# Patient Record
Sex: Female | Born: 1975
Health system: Southern US, Community
[De-identification: ages and names within clinical notes are randomized; demographics above are authoritative.]

## PROBLEM LIST (undated history)

## (undated) DIAGNOSIS — K219 Gastro-esophageal reflux disease without esophagitis: Secondary | ICD-10-CM

## (undated) HISTORY — DX: Gastro-esophageal reflux disease without esophagitis: K21.9

---
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
## 1999-09-10 ENCOUNTER — Encounter: Admission: RE | Admit: 1999-09-10 | Discharge: 1999-09-10 | Payer: Self-pay | Admitting: Family Medicine

## 1999-09-10 ENCOUNTER — Encounter: Payer: Self-pay | Admitting: Family Medicine

## 1999-10-08 ENCOUNTER — Encounter (INDEPENDENT_AMBULATORY_CARE_PROVIDER_SITE_OTHER): Payer: Self-pay | Admitting: *Deleted

## 1999-10-08 ENCOUNTER — Encounter: Payer: Self-pay | Admitting: Family Medicine

## 1999-10-08 ENCOUNTER — Ambulatory Visit (HOSPITAL_COMMUNITY): Admission: RE | Admit: 1999-10-08 | Discharge: 1999-10-08 | Payer: Self-pay | Admitting: Family Medicine

## 1999-12-13 ENCOUNTER — Emergency Department (HOSPITAL_COMMUNITY): Admission: EM | Admit: 1999-12-13 | Discharge: 1999-12-13 | Payer: Self-pay | Admitting: Emergency Medicine

## 1999-12-14 ENCOUNTER — Encounter: Payer: Self-pay | Admitting: Emergency Medicine

## 1999-12-16 ENCOUNTER — Encounter: Payer: Self-pay | Admitting: Family Medicine

## 1999-12-16 ENCOUNTER — Encounter: Admission: RE | Admit: 1999-12-16 | Discharge: 1999-12-16 | Payer: Self-pay | Admitting: Family Medicine

## 2000-06-30 ENCOUNTER — Encounter: Payer: Self-pay | Admitting: Family Medicine

## 2000-06-30 ENCOUNTER — Encounter: Admission: RE | Admit: 2000-06-30 | Discharge: 2000-06-30 | Payer: Self-pay | Admitting: Family Medicine

## 2001-04-18 HISTORY — PX: ARTHROSCOPIC REPAIR ACL: SUR80

## 2001-08-23 ENCOUNTER — Encounter: Payer: Self-pay | Admitting: Internal Medicine

## 2001-08-23 ENCOUNTER — Encounter: Admission: RE | Admit: 2001-08-23 | Discharge: 2001-08-23 | Payer: Self-pay | Admitting: Internal Medicine

## 2002-06-13 ENCOUNTER — Encounter: Admission: RE | Admit: 2002-06-13 | Discharge: 2002-06-13 | Payer: Self-pay | Admitting: Family Medicine

## 2002-06-13 ENCOUNTER — Encounter: Payer: Self-pay | Admitting: Family Medicine

## 2003-11-19 ENCOUNTER — Other Ambulatory Visit: Admission: RE | Admit: 2003-11-19 | Discharge: 2003-11-19 | Payer: Self-pay | Admitting: Family Medicine

## 2005-08-11 ENCOUNTER — Other Ambulatory Visit: Admission: RE | Admit: 2005-08-11 | Discharge: 2005-08-11 | Payer: Self-pay | Admitting: Family Medicine

## 2007-01-29 ENCOUNTER — Emergency Department (HOSPITAL_COMMUNITY): Admission: EM | Admit: 2007-01-29 | Discharge: 2007-01-29 | Payer: Self-pay | Admitting: Emergency Medicine

## 2007-04-06 ENCOUNTER — Encounter (INDEPENDENT_AMBULATORY_CARE_PROVIDER_SITE_OTHER): Payer: Self-pay | Admitting: Surgery

## 2007-04-06 ENCOUNTER — Ambulatory Visit (HOSPITAL_COMMUNITY): Admission: RE | Admit: 2007-04-06 | Discharge: 2007-04-06 | Payer: Self-pay | Admitting: Surgery

## 2008-05-13 ENCOUNTER — Ambulatory Visit (HOSPITAL_COMMUNITY): Admission: RE | Admit: 2008-05-13 | Discharge: 2008-05-13 | Payer: Self-pay | Admitting: Obstetrics and Gynecology

## 2008-11-04 ENCOUNTER — Observation Stay (HOSPITAL_COMMUNITY): Admission: RE | Admit: 2008-11-04 | Discharge: 2008-11-04 | Payer: Self-pay | Admitting: Obstetrics and Gynecology

## 2008-11-13 ENCOUNTER — Inpatient Hospital Stay (HOSPITAL_COMMUNITY): Admission: RE | Admit: 2008-11-13 | Discharge: 2008-11-16 | Payer: Self-pay | Admitting: Obstetrics and Gynecology

## 2009-04-18 HISTORY — PX: LAPAROSCOPIC CHOLECYSTECTOMY: SUR755

## 2010-04-17 ENCOUNTER — Inpatient Hospital Stay (HOSPITAL_COMMUNITY)
Admission: AD | Admit: 2010-04-17 | Discharge: 2010-04-17 | Payer: Self-pay | Source: Home / Self Care | Attending: Obstetrics and Gynecology | Admitting: Obstetrics and Gynecology

## 2010-06-28 LAB — RH IMMUNE GLOBULIN WORKUP (NOT WOMEN'S HOSP)
ABO/RH(D): O NEG
Antibody Screen: NEGATIVE
Unit division: 0

## 2010-07-14 ENCOUNTER — Inpatient Hospital Stay (HOSPITAL_COMMUNITY): Payer: 59

## 2010-07-14 ENCOUNTER — Inpatient Hospital Stay (HOSPITAL_COMMUNITY)
Admit: 2010-07-14 | Discharge: 2010-07-14 | Disposition: A | Discharge status: Home / Self Care | Payer: 59 | Attending: Surgery | Admitting: Surgery

## 2010-07-14 ENCOUNTER — Inpatient Hospital Stay (HOSPITAL_COMMUNITY)
Admission: AD | Admit: 2010-07-14 | Discharge: 2010-07-14 | Disposition: A | Discharge status: Home / Self Care | Payer: 59 | Source: Ambulatory Visit | Attending: Obstetrics | Admitting: Obstetrics

## 2010-07-14 ENCOUNTER — Other Ambulatory Visit (HOSPITAL_COMMUNITY): Payer: Self-pay | Admitting: Surgery

## 2010-07-14 ENCOUNTER — Encounter (HOSPITAL_COMMUNITY): Payer: Self-pay | Admitting: Radiology

## 2010-07-14 DIAGNOSIS — R109 Unspecified abdominal pain: Secondary | ICD-10-CM | POA: Insufficient documentation

## 2010-07-14 DIAGNOSIS — O99891 Other specified diseases and conditions complicating pregnancy: Secondary | ICD-10-CM | POA: Insufficient documentation

## 2010-07-14 DIAGNOSIS — R52 Pain, unspecified: Secondary | ICD-10-CM

## 2010-07-14 DIAGNOSIS — R1031 Right lower quadrant pain: Secondary | ICD-10-CM | POA: Insufficient documentation

## 2010-07-14 DIAGNOSIS — K37 Unspecified appendicitis: Secondary | ICD-10-CM

## 2010-07-14 DIAGNOSIS — O321XX0 Maternal care for breech presentation, not applicable or unspecified: Secondary | ICD-10-CM | POA: Insufficient documentation

## 2010-07-14 LAB — DIFFERENTIAL
Basophils Absolute: 0 10*3/uL (ref 0.0–0.1)
Basophils Relative: 0 % (ref 0–1)
Eosinophils Absolute: 0 10*3/uL (ref 0.0–0.7)
Monocytes Absolute: 0.5 10*3/uL (ref 0.1–1.0)
Monocytes Relative: 5 % (ref 3–12)
Neutro Abs: 6.5 10*3/uL (ref 1.7–7.7)
Neutrophils Relative %: 74 % (ref 43–77)

## 2010-07-14 LAB — SAMPLE TO BLOOD BANK

## 2010-07-14 LAB — CBC
Hemoglobin: 13 g/dL (ref 12.0–15.0)
MCH: 31.9 pg (ref 26.0–34.0)
MCHC: 33.9 g/dL (ref 30.0–36.0)
Platelets: 202 10*3/uL (ref 150–400)

## 2010-07-25 LAB — RH IMMUNE GLOBULIN WORKUP (NOT WOMEN'S HOSP)

## 2010-07-25 LAB — RH IMMUNE GLOB WKUP(>/=20WKS)(NOT WOMEN'S HOSP)

## 2010-07-25 LAB — CBC
HCT: 32.7 % — ABNORMAL LOW (ref 36.0–46.0)
Hemoglobin: 11.2 g/dL — ABNORMAL LOW (ref 12.0–15.0)
Hemoglobin: 13 g/dL (ref 12.0–15.0)
MCV: 96.3 fL (ref 78.0–100.0)
Platelets: 150 10*3/uL (ref 150–400)
RBC: 3.39 MIL/uL — ABNORMAL LOW (ref 3.87–5.11)
RBC: 3.95 MIL/uL (ref 3.87–5.11)
WBC: 8.2 10*3/uL (ref 4.0–10.5)
WBC: 8.5 10*3/uL (ref 4.0–10.5)

## 2010-07-25 LAB — RPR: RPR Ser Ql: NONREACTIVE

## 2010-08-03 IMAGING — US US ABDOMEN COMPLETE
1 series · 14 of 25 positions shown · non-contrast
Comparison: March 31, 2007

CLINICAL DATA: Abdominal pain

ABDOMINAL ULTRASOUND
TECHNIQUE: Abdominal ultrasound examination was performed
including evaluation of the liver, gallbladder, bile ducts,
pancreas, kidneys, spleen, IVC, and abdominal aorta.

[Series 1: us abdomen complete · 14 of 62 slices shown]
[im 1/62]
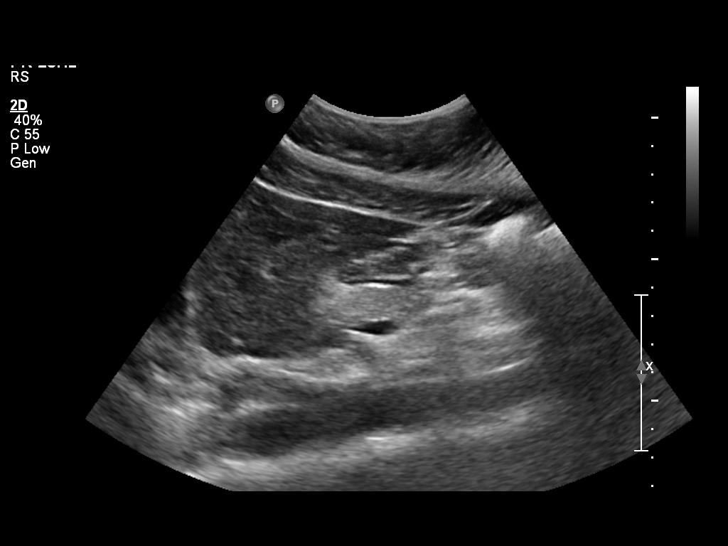
[im 6/62]
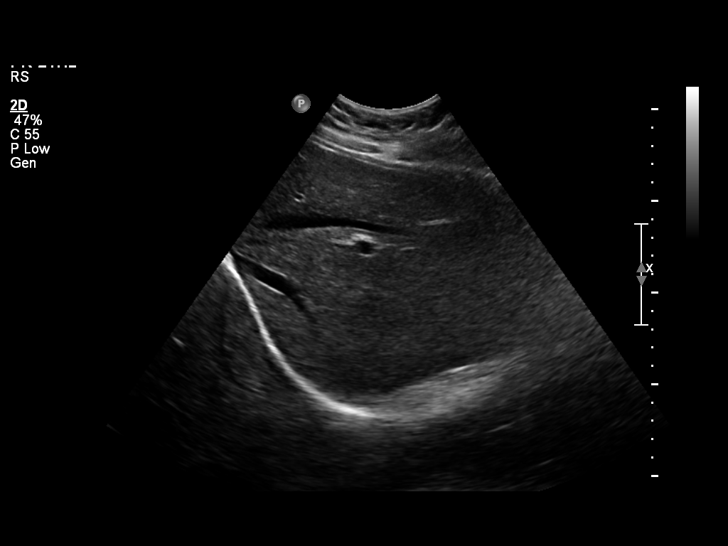
[im 11/62]
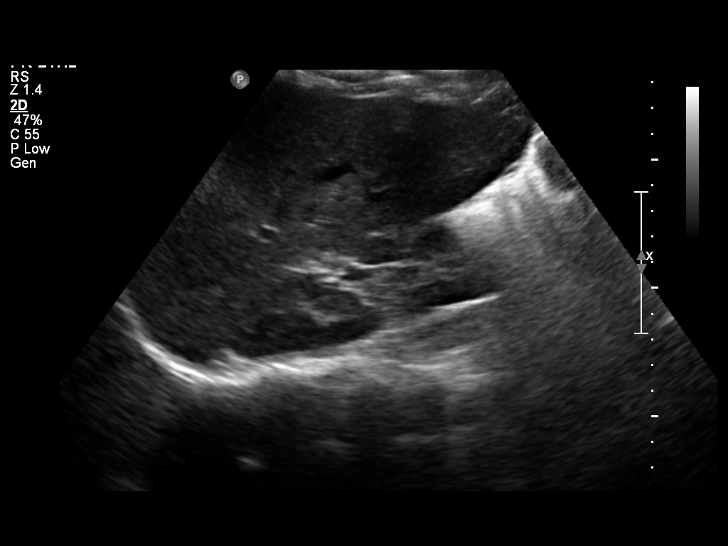
[im 16/62]
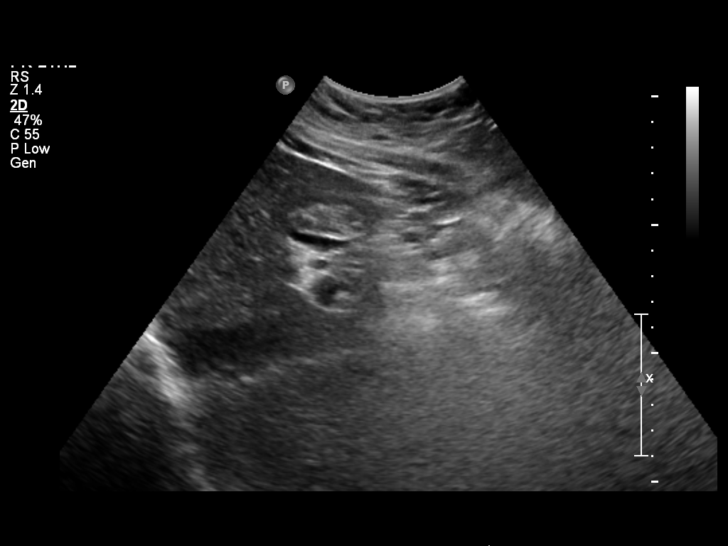
[im 21/62]
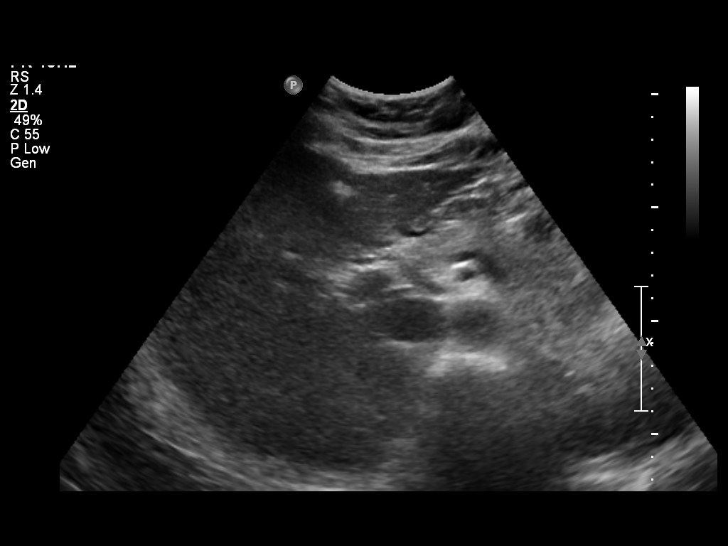
[im 23/62]
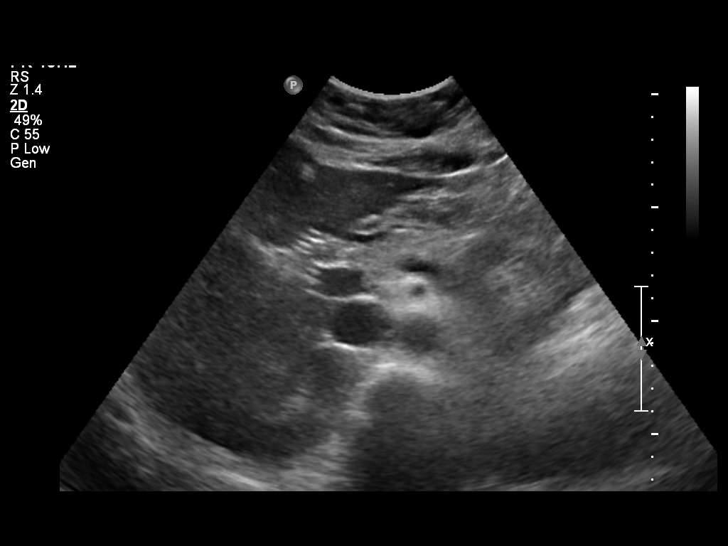
[im 28/62]
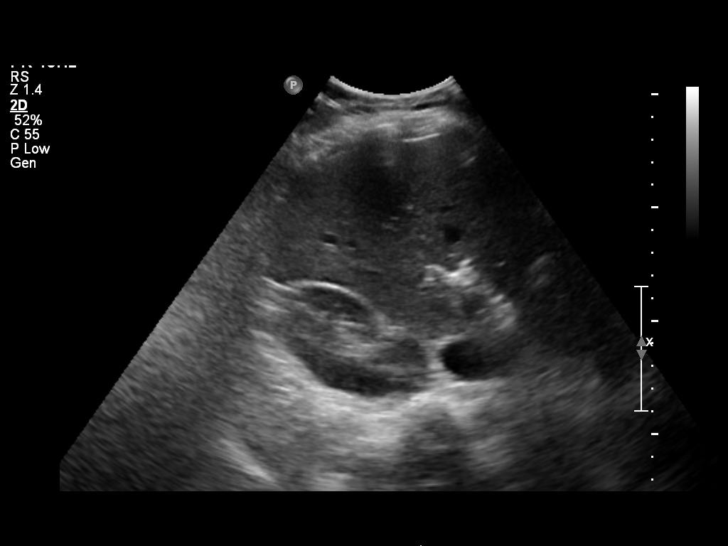
[im 34/62]
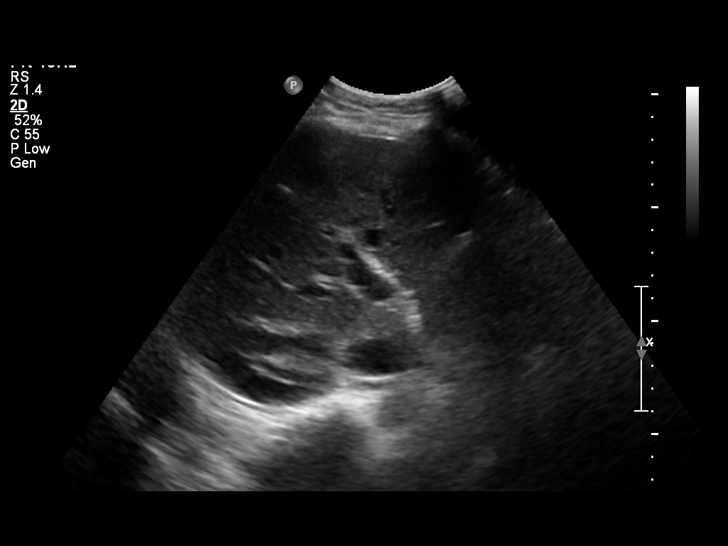
[im 39/62]
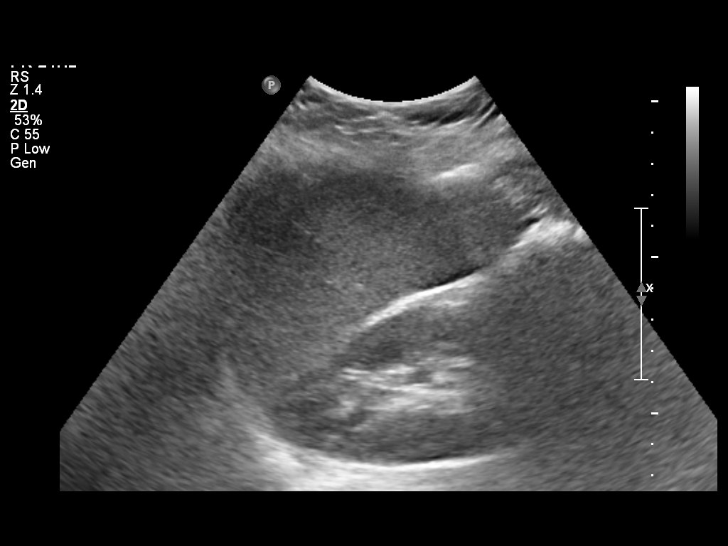
[im 41/62]
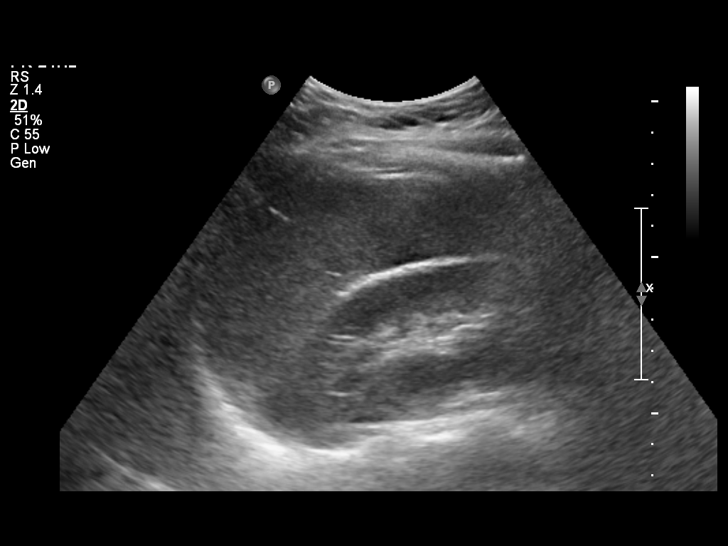
[im 46/62]
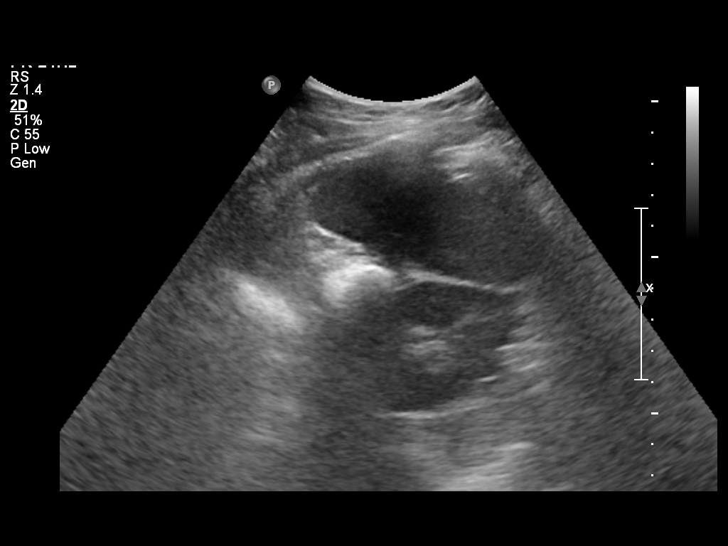
[im 51/62]
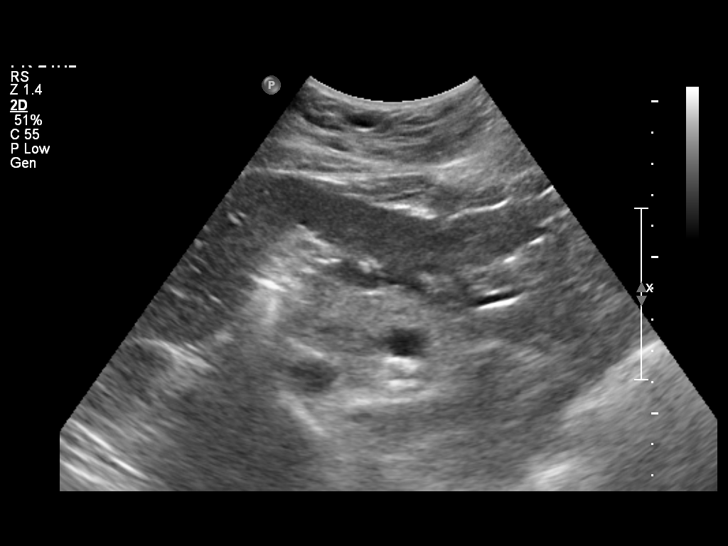
[im 56/62]
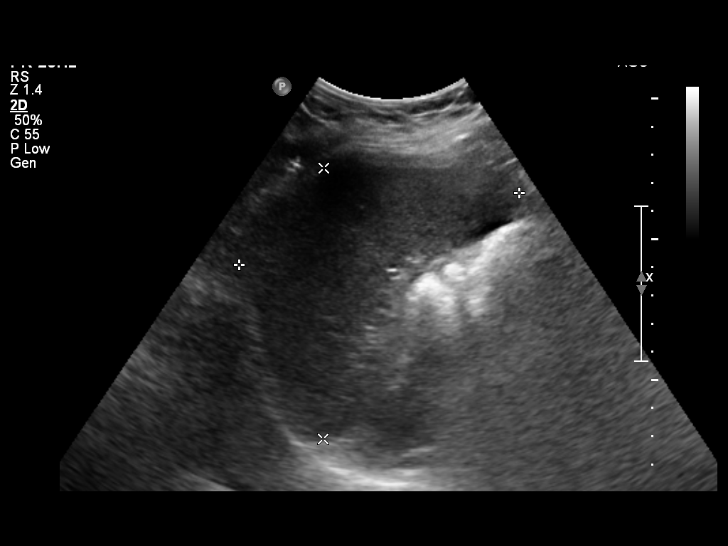
[im 62/62]
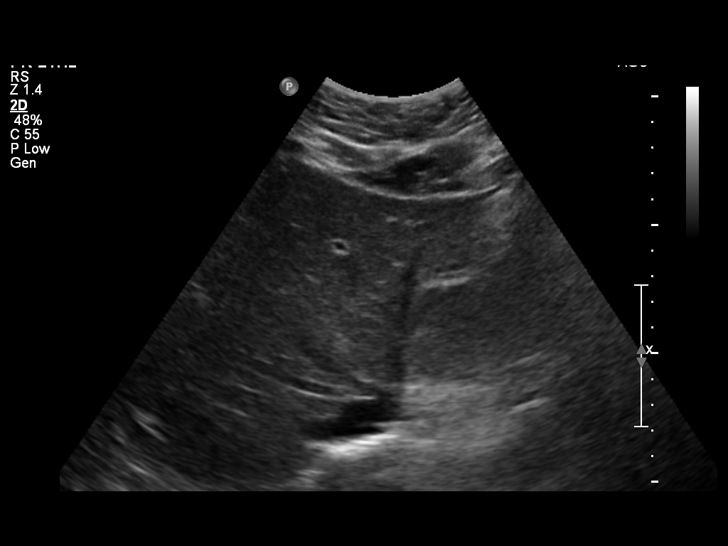

[14 of 25 positions shown; findings below may reference images not displayed]

FINDINGS: Gallbladder:  Surgically absent.

Common bile duct: Within normal limits in caliber.

Liver:  No focal parenchymal abnormalities.  Within normal limits
in parenchymal echogenicity.

Inferior vena cava:  Visualized portion unremarkable.

Pancreas:  Visualized portion unremarkable.

Spleen:  Within normal limits in size and echogenicity.

Right kidney:  Within normal limits in size and echogenicity. No
evidence of mass or hydronephrosis.

Left kidney:  Within normal limits in size and echogenicity. No
evidence of mass or hydronephrosis.

Abdominal aorta:  Within normal limits in caliber.
IMPRESSION: Negative abdominal ultrasound.

## 2010-08-31 NOTE — Op Note (Signed)
Rebecca Rivers, Rebecca Rivers            ACCOUNT NO.:  [id number]   MEDICAL RECORD NO.:  [id number]          PATIENT TYPE:  INP   LOCATION:  9126                          FACILITY:  [facility]   PHYSICIAN:  Lenoard Aden, M.D.DATE OF BIRTH:  [birth year]   DATE OF PROCEDURE:  11/13/2008  DATE OF DISCHARGE:                               OPERATIVE REPORT   PREOPERATIVE DIAGNOSES:  A 39-week intrauterine pregnancy, footling  breech presentation, status post failed external cephalic version.   POSTOPERATIVE DIAGNOSES:  A 39-week intrauterine pregnancy, footling  breech presentation, status post failed external cephalic version.   PROCEDURE:  Primary low segment transverse cesarean section.   SURGEON:  .Lenoard Aden, MD   ASSISTANT:  Marlinda Mike, CNM   ANESTHESIA:  Spinal by Malen Gauze.   ESTIMATED BLOOD LOSS:  500 mL.   COMPLICATIONS:  None.   DRAINS:  Foley counts correct.  The patient went to recovery in good  condition.   FINDINGS:  Full-term living female, footling breech position, Apgars 8 and  9.  Anterior placenta.  Right subserosal mid body/fundal fibroid  approximately 2-3 cm.  Normal tubes.  Normal ovaries.  Two-layer uterine  closure.  No extensions.   BRIEF OPERATIVE NOTE:  After being apprised of risks of anesthesia,  infection, bleeding, injury to abdominal organs, need for repair, and  delayed versus complications to include bowel and bladder injury.  The  patient brought to the operating room, where she was administered a  spinal anesthetic without complications, prepped and draped in usual  sterile fashion.  Foley catheter placed after achieving adequate  anesthesia.  Dilute Marcaine solution placed.  A Pfannenstiel skin  incision made with a scalpel, carried down to the fascia which was  nicked to the midline and transverse using Mayo scissors.  Rectus  muscles dissected sharply in the midline.  Peritoneum entered sharply.  Bladder blade placed.  Visceral peritoneum  scored sharply off the lower  uterine segment.  Kerr hysterotomy incision made.  Atraumatic delivery  of a full-term living female from footling breech position.  A standard  maneuvers performed with flexion of the fetal vertex upon delivery,  Apgars 8 and 9.  Pediatricians in attendance.  Cord blood collected.  Placenta delivered manually intact 3-vessel cord.  Uterus exteriorized,  curette using dry lap pack, and closed in 2 running imbricating layers  of 0 Monocryl suture.  Interrupted suture placed in the midline for  hemostasis.  Bladder flap inspected and found to be hemostatic.  Irrigation accomplished.  Dilute Marcaine solution placed into  the peritoneal cavity for additional anesthesia.  At this time, fascia  then closed using 0 Monocryl in a running fashion.  Subcutaneous tissue  reapproximated using a 2-0 plain in a running fashion.  Skin was closed  using staples.  The patient tolerates the procedure well and taken to  recovery in good condition.      Lenoard Aden, M.D.  Electronically Signed     RJT/MEDQ  D:  11/13/2008  T:  11/14/2008  Job:  [id number]

## 2010-08-31 NOTE — Op Note (Signed)
Rebecca Rivers, Rebecca Rivers            ACCOUNT NO.:  [id number]   MEDICAL RECORD NO.:  [id number]          PATIENT TYPE:  INP   LOCATION:  9171                          FACILITY:  [facility]   PHYSICIAN:  Lenoard Aden, M.D.DATE OF BIRTH:  Dec 06, 1975   DATE OF PROCEDURE:  11/04/2008  DATE OF DISCHARGE:  11/04/2008                               OPERATIVE REPORT   PREOPERATIVE DIAGNOSIS:  Breech for external cephalic version.   POSTOPERATIVE DIAGNOSIS:  Breech for external cephalic version.   PROCEDURE:  Attempted external cephalic version.   SURGEON:  Lenoard Aden, MD and Marlinda Mike, CNM   ESTIMATED BLOOD LOSS:  None.   COMPLICATIONS:  None.   Fetal heart tones noted pre and postprocedure with reactive NST.  Pre  and postprocedure anesthesia Stadol.   BRIEF OPERATIVE NOTE:  After being apprised of the risks and benefits of  the external cephalic version including a 50% failure rate, possible  need for emergent surgery due to fetal bradycardia, the patient is  brought to labor and delivery, where she is given an IV administered IV  Stadol.  NST was reactive.  Fetal vertex was found in the maternal right  upper quadrant, backup attempted version with 4 roll was attempted on  multiple occasions for the exact noting the fetal heart tones during the  procedure with ultrasound guidance.  After attempting on multiple  occasions, the fetus was able to be moved to transverse position, but  not to a vertex position.  At this time, procedure was terminated.  NST  was repeated and reactive.  No bleeding or fluid leakage is noted.  The  patient tolerates the procedure well and was recovering in good  condition.      Lenoard Aden, M.D.  Electronically Signed     RJT/MEDQ  D:  11/04/2008  T:  11/05/2008  Job:  [id number]

## 2010-08-31 NOTE — Op Note (Signed)
NAMELASHARN, BUFKIN            ACCOUNT NO.:  [id number]   MEDICAL RECORD NO.:  [id number]          PATIENT TYPE:  AMB   LOCATION:  DAY                          [facility]ACILITY[facility]   PHYSICIAN:  Ardeth Sportsman, MD     DATE OF BIRTH:  [birth year]   DATE OF PROCEDURE:  04/06/2007  DATE OF DISCHARGE:                               OPERATIVE REPORT   PRIMARY CARE PHYSICIAN:  Gretta Arab. Valentina Lucks, MD.   OTHER PHYSICIAN:  OB/GYN, Chester Holstein. Earlene Plater, MD.   PREOPERATIVE DIAGNOSIS:  Symptomatic cholecystolithiasis.   POSTOPERATIVE DIAGNOSIS:  Symptomatic cholecystolithiasis.   PROCEDURE PERFORMED:  Laparoscopic cholecystectomy with intraoperative  cholangiogram.   SURGEON:  Ardeth Sportsman, MD.   ASSISTANT:  Claud Kelp, MD.   ANESTHESIA:  1. General anesthesia.  2. Local anesthetic in a field block around all port sites.   SPECIMENS:  Gallbladder.   DRAINS:  None.   ESTIMATED BLOOD LOSS:  Less than 10 ml.   COMPLICATIONS:  None apparent.   INDICATIONS:  Ms. Pilger is a 35 year old, pleasant female with a  classic story of biliary colic and known gallstones.  The  pathophysiology of cholecystolithiasis with the source of gallstone  pancreatitis, cholecystitis, and debilitating nausea, vomiting, and  dehydration were discussed.  Options were discussed and recommendation  was made for a laparoscopic cholecystectomy with intraoperative  cholangiogram.  Risks such as stroke, heart attack, deep venous  thrombosis, pulmonary embolus, and death were discussed.  Risks such as  bleeding, need for a transfusion, wound infection, abscess, injury to  other organs, incisional hernia, prolonged pain, bile duct injuries with  ultimately the need of operative reconstruction or percutaneous drainage  or endoscopic stenting were discussed.  Questions answered and she  agreed to proceed.   OPERATIVE FINDINGS:  She had some mild gallbladder wall thickening and a  few adhesions of omentum to her  gallbladder.  Her cholangiogram was  normal showing a somewhat narrow biliary system but intact.  There was  no evidence of choledocholithiasis.   DESCRIPTION OF PROCEDURE:  Informed consent was confirmed.  The patient  was on sequential compression devices during the entire case.  She  voided just prior to going into the operating room.  She was positioned  in the supine and both arms were tucked.  Her abdomen was prepped and  draped in a sterile fashion after a general anesthesia was completed  without any difficulty.   Entry was gained in the abdomen with the patient in steep reverse  Trendelenburg with the right side up using placement of a 5-mm port in  the right upper quadrant using optical entry technique.  The 5-mm port  was replaced in the right flank and supraumbilically.  A 10-mm port was  tunneled through the falciform ligament in the subxiphoid region with  capnoperitoneum and 15 mmHg.  There was a small tear on the anterior  liver wall that was easily controlled with cautery with placement of the  latter port.   The gallbladder fundus was grasped and elevated cephalad.  Omental  attachments were freed up using a controlled cautery and  blunt  dissection.  The perineal coverings between the gallbladder and liver on  the anteromedial and posterolateral aspects were freed off carefully.  Circumferential dissection was done until a critical view revealing only  two structures going from the gallbladder down to the porta hepatis.  The wound was pulsatile on the anteromedial wall consistent with a  cystic artery.  One clip on the gallbladder side and two clips safely  proximally were made, and the cystic artery transection was completed.   A clip was placed on the gallbladder infundibulum and a partial cystic  ductotomy was performed with the release of clear bile.  A #5-French  cholangiocatheter was passed through a right subcostal puncture site,  flushed, and passed the  cystic duct without difficulty.  A cholangiogram  was run using diluted radiopaque contrast and continuous fluoroscopy.  Two runs were done total.  The contrast flowed well from a side helical  branch consistent with cystic duct cannulization.  The contrast flowed  well into the right and left intrahepatic chains and across the common  hepatic and bile ducts into a normal duodenum.  On initial run, it  seemed like the proximal common hepatic duct was a little bit stenosed,  but on rerun it dilated up in a normal size and there was no evidence of  any abnormalities.  There was no leak of bile.  The cholangiocatheter  was removed.  Four clips remained on the cystic duct just slightly  proximal to this since she had a nice long cystic duct and the cystic  duct transection was completed.   The gallbladder was freed from its remaining attachments on the liver  and removed into an EndoCatch bag through the subxiphoid port with  minimal dilation.  A fascial defect in the subxiphoid region was large  enough to allow my pinky to pass and therefore I closed it to help  reapproximate using a #0 Vicryl using a laparoscopic suture passer under  direct visualization.  Copious irrigation was done with a clear return.  There had been some source of bile and one or two stones, but these were  easily caught up with the suction and graspers.  I had clear return at  the end.  Hemostasis was excellent.  The clips were intact on the cystic  duct and arterial stumps.  The capnoperitoneum was actively evacuated  and ports were removed.  The fascial stitch was tied down in the  subxiphoid region.  The skin was closed using a #4-0 Monocryl stitch.  A  sterile dressing was applied.  The patient was extubated and sent to the  recovery room in stable condition.  I had explained the postoperative  recovery plans for the patient just prior to surgery, and I am about to  discuss it with her husband afterwards.       Ardeth Sportsman, MD  Electronically Signed     SCG/MEDQ  D:  04/06/2007  T:  04/07/2007  Job:  [id number]

## 2010-11-05 ENCOUNTER — Inpatient Hospital Stay (HOSPITAL_COMMUNITY): Admission: RE | Admit: 2010-11-05 | Payer: 59 | Source: Ambulatory Visit

## 2010-11-05 ENCOUNTER — Encounter (HOSPITAL_COMMUNITY): Payer: Self-pay | Admitting: *Deleted

## 2010-11-05 ENCOUNTER — Encounter (HOSPITAL_COMMUNITY): Payer: 59

## 2010-11-05 ENCOUNTER — Inpatient Hospital Stay (HOSPITAL_COMMUNITY)
Admission: AD | Admit: 2010-11-05 | Discharge: 2010-11-08 | DRG: 765 | Disposition: A | Discharge status: Home / Self Care | Payer: 59 | Source: Ambulatory Visit | Attending: Obstetrics and Gynecology | Admitting: Obstetrics and Gynecology

## 2010-11-05 ENCOUNTER — Telehealth (HOSPITAL_COMMUNITY): Payer: Self-pay | Admitting: *Deleted

## 2010-11-05 DIAGNOSIS — D509 Iron deficiency anemia, unspecified: Secondary | ICD-10-CM | POA: Diagnosis present

## 2010-11-05 DIAGNOSIS — O480 Post-term pregnancy: Secondary | ICD-10-CM | POA: Diagnosis present

## 2010-11-05 DIAGNOSIS — O34219 Maternal care for unspecified type scar from previous cesarean delivery: Secondary | ICD-10-CM | POA: Diagnosis present

## 2010-11-05 DIAGNOSIS — D62 Acute posthemorrhagic anemia: Secondary | ICD-10-CM | POA: Diagnosis not present

## 2010-11-05 DIAGNOSIS — O9903 Anemia complicating the puerperium: Secondary | ICD-10-CM | POA: Diagnosis not present

## 2010-11-05 DIAGNOSIS — Z98891 History of uterine scar from previous surgery: Secondary | ICD-10-CM

## 2010-11-05 LAB — HEPATITIS B SURFACE ANTIGEN: Hepatitis B Surface Ag: NEGATIVE

## 2010-11-05 LAB — CBC
HCT: 33.2 % — ABNORMAL LOW (ref 36.0–46.0)
Hemoglobin: 10.9 g/dL — ABNORMAL LOW (ref 12.0–15.0)
MCH: 29.7 pg (ref 26.0–34.0)
MCHC: 32.8 g/dL (ref 30.0–36.0)
MCV: 90.5 fL (ref 78.0–100.0)
Platelets: 182 10*3/uL (ref 150–400)
RBC: 3.67 MIL/uL — ABNORMAL LOW (ref 3.87–5.11)
RDW: 13.4 % (ref 11.5–15.5)
WBC: 8.4 10*3/uL (ref 4.0–10.5)

## 2010-11-05 LAB — GC/CHLAMYDIA PROBE AMP, GENITAL
Chlamydia: NEGATIVE
Gonorrhea: NEGATIVE

## 2010-11-05 LAB — STREP B DNA PROBE: GBS: NEGATIVE

## 2010-11-05 LAB — HIV ANTIBODY (ROUTINE TESTING W REFLEX): HIV: NONREACTIVE

## 2010-11-05 LAB — ABO/RH

## 2010-11-05 LAB — RPR
RPR: NONREACTIVE
RPR: NONREACTIVE

## 2010-11-05 MED ORDER — LACTATED RINGERS IV SOLN: 500.0000 mL | INTRAVENOUS | Status: DC | PRN

## 2010-11-05 MED ORDER — NALBUPHINE SYRINGE 5 MG/0.5 ML
10.0000 mg | INJECTION | Freq: Once | INTRAMUSCULAR | Status: AC
  Administered 2010-11-06: 10 mg via INTRAVENOUS
  Filled 2010-11-05: qty 1

## 2010-11-05 MED ORDER — OXYTOCIN 20 UNITS IN LACTATED RINGERS INFUSION - SIMPLE
4.0000 m[IU]/min | INTRAVENOUS | Status: AC
  Administered 2010-11-05: 2 m[IU]/min via INTRAVENOUS
  Filled 2010-11-05: qty 1000

## 2010-11-05 MED ORDER — FAMOTIDINE 20 MG PO TABS
20.0000 mg | ORAL_TABLET | Freq: Every day | ORAL | Status: DC
  Administered 2010-11-06: 20 mg via ORAL
  Filled 2010-11-05: qty 1

## 2010-11-05 MED ORDER — LACTATED RINGERS IV SOLN
INTRAVENOUS | Status: DC
  Administered 2010-11-05: 23:00:00 via INTRAVENOUS

## 2010-11-05 MED ORDER — CITRIC ACID-SODIUM CITRATE 334-500 MG/5ML PO SOLN: 30.0000 mL | ORAL | Status: DC | PRN

## 2010-11-05 MED ORDER — TERBUTALINE SULFATE 1 MG/ML IJ SOLN: 0.2500 mg | Freq: Once | INTRAMUSCULAR | Status: AC | PRN

## 2010-11-05 MED ORDER — ACETAMINOPHEN 325 MG PO TABS: 650.0000 mg | ORAL_TABLET | ORAL | Status: DC | PRN

## 2010-11-05 MED ORDER — NALBUPHINE HCL 10 MG/ML IJ SOLN: 10.0000 mg | Freq: Once | INTRAMUSCULAR | Status: DC

## 2010-11-05 MED ORDER — ONDANSETRON HCL 4 MG/2ML IJ SOLN: 4.0000 mg | Freq: Four times a day (QID) | INTRAMUSCULAR | Status: DC | PRN

## 2010-11-05 NOTE — Progress Notes (Signed)
  S: Feeling well      Here for IOL - planned cervical balloon placement      Previous CS -desires TOLAC after informed consent   O:  VS: Temperature 98.3 F (36.8 C), temperature source Oral, resp. rate 20, height 5\' 3"  (1.6 m), weight 95.709 kg (211 lb).        FHR : baseline 150 / variability moderate / accels present / decels absent        EFM: Category 1        Toco: contractions every 3-8 minutes / mild ctx x 30-50 seconds         Cervix : ext 2 cm funnels to FT / 60 % / vtx / -2 / soft / anterior funnels anteriorly        Membranes: intact  A: Post dates pregnancy at 4 3/7     Previous cesarean section - desires TOLAC     Difficult cervical exam - unable to place cervical balloon after 30 minutes - 6 attempts     Small bloody show and mucus + after placement attempt / uncomfortable for patient   Discussed with patient and spouse - unable to place cervical balloon due to cervical funneling and patient discomfort Recommend pitocin at low dose thru the night - recheck in am and re-attempt placement Iv sedation with Nubain for sleep - may consider analgesia for cervical balloon placement tomorrow  P: Pitocin prime dose tonight x 8 hours with 4 mu/min      Re-attempt cervical balloon in am      Nubain 10 mg IV for sleep / rest     Rebecca Rivers 11/05/2010, 10:52 PM

## 2010-11-05 NOTE — H&P (Signed)
  Rebecca Rivers is a 35 y.o. female presenting for Labor induction / TOLAC. PNCare at [facility] since 8 wks with Marlinda Mike CNM as primary. Previous CS for breech at term - failed ECV. Desires TOLAC  OB History    Grav Para Term Preterm Abortions TAB SAB Ect Mult Living   3 1 1  0 1 0 1 0 0 1     History reviewed. No pertinent past medical history. Past Surgical History  Procedure Date  . Cesarean section   . Laparoscopic cholecystectomy 2011  . Arthroscopic repair acl 2003  . Adenoidectomy 1983   Family History: family history includes Diabetes in her father. Social History:  reports that she has never smoked. She does not have any smokeless tobacco history on file. She reports that she does not drink alcohol or use illicit drugs.  ROS: Active FM Rare contractions Some rare spotting / no LOF  Physical exam: General -NAD / alert and oriented Heart - RRR Lungs - clear Abdomen- gravid, nontender  Cervical exam: Dilation: Fingertip Effacement (%): 50 Station: -2 Temperature 98.3 F (36.8 C), temperature source Oral, resp. rate 20, height 5\' 3"  (1.6 m), weight 95.709 kg (211 lb).     Prenatal labs: ABO, Rh: O NEG (12/31 1800) Antibody: NEG (12/31 1800) Rubella:   RPR: Nonreactive (07/20 0000)  HBsAg: Negative (07/20 0000)  HIV: Non-reactive (07/20 0000)  GBS: Negative (07/20 0000)  1 hr Glucola 116 Genetic screening  Anatomy US - normal   Assessment/Plan: 40 weeks with cephalic presentation Previous cs - desires TOLAC Cervical ripening with cervical balloon - arom - expectant management - [facility] in active labor   Aerion Bagdasarian 11/05/2010, 10:30 PM

## 2010-11-06 ENCOUNTER — Encounter (HOSPITAL_COMMUNITY)
Admission: AD | Disposition: A | Discharge status: Home / Self Care | Payer: Self-pay | Source: Ambulatory Visit | Attending: Obstetrics and Gynecology

## 2010-11-06 ENCOUNTER — Encounter (HOSPITAL_COMMUNITY): Payer: Self-pay

## 2010-11-06 ENCOUNTER — Encounter (HOSPITAL_COMMUNITY): Payer: Self-pay | Admitting: *Deleted

## 2010-11-06 ENCOUNTER — Inpatient Hospital Stay (HOSPITAL_COMMUNITY): Payer: 59

## 2010-11-06 LAB — RPR: RPR Ser Ql: NONREACTIVE

## 2010-11-06 SURGERY — Surgical Case
Anesthesia: Regional | Site: Abdomen | Wound class: Clean Contaminated

## 2010-11-06 MED ORDER — OXYCODONE-ACETAMINOPHEN 5-325 MG PO TABS
1.0000 | ORAL_TABLET | ORAL | Status: DC | PRN
  Administered 2010-11-07 – 2010-11-08 (×3): 1 via ORAL
  Filled 2010-11-06 (×3): qty 1

## 2010-11-06 MED ORDER — BUPIVACAINE HCL (PF) 0.25 % IJ SOLN
INTRAMUSCULAR | Status: DC | PRN
  Administered 2010-11-06: 9 mL

## 2010-11-06 MED ORDER — ONDANSETRON HCL 4 MG PO TABS: 4.0000 mg | ORAL_TABLET | ORAL | Status: DC | PRN

## 2010-11-06 MED ORDER — MEPERIDINE HCL 25 MG/ML IJ SOLN: 6.2500 mg | INTRAMUSCULAR | Status: DC | PRN

## 2010-11-06 MED ORDER — LANOLIN HYDROUS EX OINT: 1.0000 "application " | TOPICAL_OINTMENT | CUTANEOUS | Status: DC | PRN

## 2010-11-06 MED ORDER — DEXTROSE IN LACTATED RINGERS 5 % IV SOLN: INTRAVENOUS | Status: DC

## 2010-11-06 MED ORDER — KETOROLAC TROMETHAMINE 60 MG/2ML IM SOLN
INTRAMUSCULAR | Status: AC
  Administered 2010-11-06: 60 mg via INTRAMUSCULAR
  Filled 2010-11-06: qty 2

## 2010-11-06 MED ORDER — LACTATED RINGERS IV SOLN
INTRAVENOUS | Status: DC | PRN
  Administered 2010-11-06 (×3): via INTRAVENOUS

## 2010-11-06 MED ORDER — ONDANSETRON HCL 4 MG/2ML IJ SOLN
4.0000 mg | Freq: Three times a day (TID) | INTRAMUSCULAR | Status: DC | PRN
  Filled 2010-11-06: qty 2

## 2010-11-06 MED ORDER — HYDROMORPHONE HCL 1 MG/ML IJ SOLN: 0.2500 mg | INTRAMUSCULAR | Status: DC | PRN

## 2010-11-06 MED ORDER — NALOXONE HCL 0.4 MG/ML IJ SOLN: 0.4000 mg | INTRAMUSCULAR | Status: DC | PRN

## 2010-11-06 MED ORDER — DIPHENHYDRAMINE HCL 50 MG/ML IJ SOLN: 12.5000 mg | INTRAMUSCULAR | Status: DC | PRN

## 2010-11-06 MED ORDER — PRENATAL PLUS 27-1 MG PO TABS
1.0000 | ORAL_TABLET | Freq: Every day | ORAL | Status: DC
  Administered 2010-11-07 – 2010-11-08 (×2): 1 via ORAL
  Filled 2010-11-06 (×2): qty 1

## 2010-11-06 MED ORDER — MENTHOL 3 MG MT LOZG: 1.0000 | LOZENGE | OROMUCOSAL | Status: DC | PRN

## 2010-11-06 MED ORDER — ONDANSETRON HCL 4 MG/2ML IJ SOLN
4.0000 mg | INTRAMUSCULAR | Status: DC | PRN
  Administered 2010-11-06: 2 mg via INTRAVENOUS

## 2010-11-06 MED ORDER — SODIUM CHLORIDE 0.9 % IJ SOLN: 3.0000 mL | INTRAMUSCULAR | Status: DC | PRN

## 2010-11-06 MED ORDER — NALBUPHINE HCL 10 MG/ML IJ SOLN
5.0000 mg | INTRAMUSCULAR | Status: AC | PRN
  Filled 2010-11-06: qty 1

## 2010-11-06 MED ORDER — SODIUM CHLORIDE 0.9 % IR SOLN
Status: DC | PRN
  Administered 2010-11-06: 1000 mL

## 2010-11-06 MED ORDER — KETOROLAC TROMETHAMINE 30 MG/ML IJ SOLN: 15.0000 mg | Freq: Once | INTRAMUSCULAR | Status: DC | PRN

## 2010-11-06 MED ORDER — SODIUM CHLORIDE 0.9 % IV SOLN
1.0000 ug/kg/h | INTRAVENOUS | Status: DC | PRN
  Filled 2010-11-06: qty 2.5

## 2010-11-06 MED ORDER — SODIUM CHLORIDE 0.9 % IJ SOLN: 3.0000 mL | Freq: Two times a day (BID) | INTRAMUSCULAR | Status: DC

## 2010-11-06 MED ORDER — OXYTOCIN 10 UNIT/ML IJ SOLN
INTRAMUSCULAR | Status: AC
  Filled 2010-11-06: qty 4

## 2010-11-06 MED ORDER — OXYTOCIN 10 UNIT/ML IJ SOLN
INTRAMUSCULAR | Status: AC
  Filled 2010-11-06: qty 2

## 2010-11-06 MED ORDER — FENTANYL CITRATE 0.05 MG/ML IJ SOLN
INTRAMUSCULAR | Status: DC | PRN
  Administered 2010-11-06 (×3): 25 ug via INTRAVENOUS
  Administered 2010-11-06: 5 ug via INTRAVENOUS

## 2010-11-06 MED ORDER — IBUPROFEN 600 MG PO TABS
600.0000 mg | ORAL_TABLET | Freq: Four times a day (QID) | ORAL | Status: DC
  Administered 2010-11-07 – 2010-11-08 (×5): 600 mg via ORAL
  Filled 2010-11-06 (×2): qty 1

## 2010-11-06 MED ORDER — FENTANYL CITRATE 0.05 MG/ML IJ SOLN
INTRAMUSCULAR | Status: DC | PRN
  Administered 2010-11-06: 20 ug via INTRATHECAL

## 2010-11-06 MED ORDER — OXYTOCIN 20 UNITS IN LACTATED RINGERS INFUSION - SIMPLE
INTRAVENOUS | Status: DC | PRN
  Administered 2010-11-06: 20 [IU] via INTRAVENOUS
  Administered 2010-11-06: 40 [IU] via INTRAVENOUS

## 2010-11-06 MED ORDER — FLEET ENEMA 7-19 GM/118ML RE ENEM: 1.0000 | ENEMA | RECTAL | Status: DC | PRN

## 2010-11-06 MED ORDER — ONDANSETRON HCL 4 MG/2ML IJ SOLN: 4.0000 mg | Freq: Once | INTRAMUSCULAR | Status: DC | PRN

## 2010-11-06 MED ORDER — EPHEDRINE SULFATE 50 MG/ML IJ SOLN
INTRAMUSCULAR | Status: DC | PRN
  Administered 2010-11-06 (×2): 20 mg via INTRAVENOUS
  Administered 2010-11-06: 10 mg via INTRAVENOUS

## 2010-11-06 MED ORDER — FERROUS SULFATE 325 (65 FE) MG PO TABS
325.0000 mg | ORAL_TABLET | Freq: Two times a day (BID) | ORAL | Status: DC
  Administered 2010-11-07 – 2010-11-08 (×3): 325 mg via ORAL
  Filled 2010-11-06 (×3): qty 1

## 2010-11-06 MED ORDER — PHENYLEPHRINE 40 MCG/ML (10ML) SYRINGE FOR IV PUSH (FOR BLOOD PRESSURE SUPPORT)
PREFILLED_SYRINGE | INTRAVENOUS | Status: AC
  Filled 2010-11-06: qty 5

## 2010-11-06 MED ORDER — DIPHENHYDRAMINE HCL 25 MG PO CAPS: 25.0000 mg | ORAL_CAPSULE | Freq: Four times a day (QID) | ORAL | Status: DC | PRN

## 2010-11-06 MED ORDER — DIPHENHYDRAMINE HCL 25 MG PO CAPS: 25.0000 mg | ORAL_CAPSULE | ORAL | Status: DC | PRN

## 2010-11-06 MED ORDER — CEFAZOLIN SODIUM 1-5 GM-% IV SOLN
INTRAVENOUS | Status: AC
  Filled 2010-11-06: qty 100

## 2010-11-06 MED ORDER — SENNOSIDES-DOCUSATE SODIUM 8.6-50 MG PO TABS
1.0000 | ORAL_TABLET | Freq: Every day | ORAL | Status: DC
  Administered 2010-11-06: 1 via ORAL
  Administered 2010-11-07: 2 via ORAL

## 2010-11-06 MED ORDER — OXYTOCIN 20 UNITS IN LACTATED RINGERS INFUSION - SIMPLE: 4.0000 m[IU]/min | INTRAVENOUS | Status: DC

## 2010-11-06 MED ORDER — MORPHINE SULFATE (PF) 0.5 MG/ML IJ SOLN
INTRAMUSCULAR | Status: DC | PRN
  Administered 2010-11-06: .1 ug via INTRATHECAL

## 2010-11-06 MED ORDER — CEFAZOLIN SODIUM-DEXTROSE 2-3 GM-% IV SOLR: 2.0000 g | INTRAVENOUS | Status: DC

## 2010-11-06 MED ORDER — ONDANSETRON HCL 4 MG/2ML IJ SOLN
INTRAMUSCULAR | Status: AC
  Filled 2010-11-06: qty 2

## 2010-11-06 MED ORDER — LACTATED RINGERS IV SOLN: INTRAVENOUS | Status: AC

## 2010-11-06 MED ORDER — BISACODYL 10 MG RE SUPP: 10.0000 mg | Freq: Every day | RECTAL | Status: DC | PRN

## 2010-11-06 MED ORDER — CITRIC ACID-SODIUM CITRATE 334-500 MG/5ML PO SOLN
ORAL | Status: AC
  Administered 2010-11-06: 30 mL
  Filled 2010-11-06: qty 15

## 2010-11-06 MED ORDER — SODIUM CHLORIDE 0.9 % IV SOLN: 250.0000 mL | INTRAVENOUS | Status: DC

## 2010-11-06 MED ORDER — DEXTROSE IN LACTATED RINGERS 5 % IV SOLN
INTRAVENOUS | Status: DC
  Administered 2010-11-06: 16:00:00 via INTRAVENOUS

## 2010-11-06 MED ORDER — SCOPOLAMINE 1 MG/3DAYS TD PT72
MEDICATED_PATCH | TRANSDERMAL | Status: AC
  Filled 2010-11-06: qty 1

## 2010-11-06 MED ORDER — DIPHENHYDRAMINE HCL 50 MG/ML IJ SOLN: 25.0000 mg | INTRAMUSCULAR | Status: DC | PRN

## 2010-11-06 MED ORDER — ZOLPIDEM TARTRATE 5 MG PO TABS: 5.0000 mg | ORAL_TABLET | Freq: Every evening | ORAL | Status: DC | PRN

## 2010-11-06 MED ORDER — CEFAZOLIN SODIUM 1-5 GM-% IV SOLN
INTRAVENOUS | Status: DC | PRN
  Administered 2010-11-06: 2 g via INTRAVENOUS

## 2010-11-06 MED ORDER — IBUPROFEN 600 MG PO TABS
600.0000 mg | ORAL_TABLET | Freq: Four times a day (QID) | ORAL | Status: DC | PRN
  Administered 2010-11-07 (×2): 600 mg via ORAL
  Filled 2010-11-06 (×5): qty 1

## 2010-11-06 MED ORDER — KETOROLAC TROMETHAMINE 60 MG/2ML IM SOLN
60.0000 mg | Freq: Once | INTRAMUSCULAR | Status: AC | PRN
  Administered 2010-11-06: 60 mg via INTRAMUSCULAR

## 2010-11-06 MED ORDER — OXYTOCIN 20 UNITS IN LACTATED RINGERS INFUSION - SIMPLE
1.0000 m[IU]/min | INTRAVENOUS | Status: DC
  Administered 2010-11-06: 10 m[IU]/min via INTRAVENOUS

## 2010-11-06 MED ORDER — SIMETHICONE 80 MG PO CHEW
80.0000 mg | CHEWABLE_TABLET | Freq: Three times a day (TID) | ORAL | Status: DC
  Administered 2010-11-06 – 2010-11-07 (×4): 80 mg via ORAL

## 2010-11-06 MED ORDER — MORPHINE SULFATE 0.5 MG/ML IJ SOLN
INTRAMUSCULAR | Status: AC
  Filled 2010-11-06: qty 10

## 2010-11-06 MED ORDER — EPHEDRINE 5 MG/ML INJ
INTRAVENOUS | Status: AC
  Filled 2010-11-06: qty 10

## 2010-11-06 MED ORDER — SCOPOLAMINE 1 MG/3DAYS TD PT72
1.0000 | MEDICATED_PATCH | Freq: Once | TRANSDERMAL | Status: DC
  Administered 2010-11-06: 1.5 mg via TRANSDERMAL

## 2010-11-06 MED ORDER — FENTANYL CITRATE 0.05 MG/ML IJ SOLN
INTRAMUSCULAR | Status: AC
  Filled 2010-11-06: qty 2

## 2010-11-06 MED ORDER — CEFAZOLIN SODIUM-DEXTROSE 2-3 GM-% IV SOLR
2.0000 g | Freq: Three times a day (TID) | INTRAVENOUS | Status: AC
  Administered 2010-11-06: 2 g via INTRAVENOUS
  Filled 2010-11-06 (×2): qty 50

## 2010-11-06 MED ORDER — ONDANSETRON HCL 4 MG/2ML IJ SOLN
INTRAMUSCULAR | Status: DC | PRN
  Administered 2010-11-06: 4 mg via INTRAVENOUS

## 2010-11-06 MED ORDER — METHYLERGONOVINE MALEATE 0.2 MG PO TABS: 0.2000 mg | ORAL_TABLET | ORAL | Status: DC | PRN

## 2010-11-06 MED ORDER — OXYTOCIN 20 UNITS IN LACTATED RINGERS INFUSION - SIMPLE: 125.0000 mL/h | INTRAVENOUS | Status: AC

## 2010-11-06 MED ORDER — SIMETHICONE 80 MG PO CHEW: 80.0000 mg | CHEWABLE_TABLET | ORAL | Status: DC | PRN

## 2010-11-06 MED ORDER — WITCH HAZEL-GLYCERIN EX PADS: MEDICATED_PAD | CUTANEOUS | Status: DC | PRN

## 2010-11-06 MED ORDER — METHYLERGONOVINE MALEATE 0.2 MG/ML IJ SOLN: 0.2000 mg | INTRAMUSCULAR | Status: DC | PRN

## 2010-11-06 MED ORDER — IBUPROFEN 200 MG PO TABS: 200.0000 mg | ORAL_TABLET | Freq: Four times a day (QID) | ORAL | Status: DC | PRN

## 2010-11-06 SURGICAL SUPPLY — 45 items
APL SKNCLS STERI-STRIP NONHPOA (GAUZE/BANDAGES/DRESSINGS)
BENZOIN TINCTURE PRP APPL 2/3 (GAUZE/BANDAGES/DRESSINGS) IMPLANT
CLOTH BEACON ORANGE TIMEOUT ST (SAFETY) ×2 IMPLANT
CONTAINER PREFILL 10% NBF 15ML (MISCELLANEOUS) IMPLANT
DRAPE UTILITY XL STRL (DRAPES) ×1 IMPLANT
DRESSING TELFA 8X3 (GAUZE/BANDAGES/DRESSINGS) ×1 IMPLANT
ELECT REM PT RETURN 9FT ADLT (ELECTROSURGICAL) ×2
ELECTRODE REM PT RTRN 9FT ADLT (ELECTROSURGICAL) ×1 IMPLANT
EXTRACTOR VACUUM KIWI (MISCELLANEOUS) IMPLANT
EXTRACTOR VACUUM M CUP 4 TUBE (SUCTIONS) IMPLANT
GAUZE SPONGE 4X4 12PLY STRL LF (GAUZE/BANDAGES/DRESSINGS) ×2 IMPLANT
GLOVE BIO SURGEON STRL SZ 6.5 (GLOVE) ×4 IMPLANT
GLOVE BIOGEL PI IND STRL 7.0 (GLOVE) ×2 IMPLANT
GLOVE BIOGEL PI INDICATOR 7.0 (GLOVE) ×4
GLOVE SKINSENSE NS SZ6.5 (GLOVE) ×2
GLOVE SKINSENSE STRL SZ6.5 (GLOVE) IMPLANT
GOWN BRE IMP SLV AUR LG STRL (GOWN DISPOSABLE) ×7 IMPLANT
KIT ABG SYR 3ML LUER SLIP (SYRINGE) IMPLANT
NDL HYPO 25X1 1.5 SAFETY (NEEDLE) ×1 IMPLANT
NDL HYPO 25X5/8 SAFETYGLIDE (NEEDLE) IMPLANT
NEEDLE HYPO 25X1 1.5 SAFETY (NEEDLE) ×2 IMPLANT
NEEDLE HYPO 25X5/8 SAFETYGLIDE (NEEDLE) IMPLANT
NS IRRIG 1000ML POUR BTL (IV SOLUTION) ×2 IMPLANT
PACK C SECTION WH (CUSTOM PROCEDURE TRAY) ×2 IMPLANT
PAD ABD 7.5X8 STRL (GAUZE/BANDAGES/DRESSINGS) ×1 IMPLANT
RTRCTR C-SECT PINK 25CM LRG (MISCELLANEOUS) IMPLANT
SLEEVE SCD COMPRESS KNEE MED (MISCELLANEOUS) ×1 IMPLANT
STAPLER VISISTAT 35W (STAPLE) IMPLANT
STRIP CLOSURE SKIN 1/2X4 (GAUZE/BANDAGES/DRESSINGS) IMPLANT
SUT CHROMIC GUT AB #0 18 (SUTURE) IMPLANT
SUT MNCRL 0 VIOLET CTX 36 (SUTURE) ×3 IMPLANT
SUT MON AB 4-0 PS1 27 (SUTURE) ×1 IMPLANT
SUT MONOCRYL 0 CTX 36 (SUTURE) ×3
SUT PLAIN 2 0 (SUTURE)
SUT PLAIN 2 0 XLH (SUTURE) ×1 IMPLANT
SUT PLAIN ABS 2-0 CT1 27XMFL (SUTURE) IMPLANT
SUT VIC AB 0 CT1 27 (SUTURE) ×4
SUT VIC AB 0 CT1 27XBRD ANBCTR (SUTURE) ×2 IMPLANT
SUT VIC AB 2-0 CT1 27 (SUTURE) ×2
SUT VIC AB 2-0 CT1 TAPERPNT 27 (SUTURE) ×1 IMPLANT
SUT VICRYL 0 TIES 12 18 (SUTURE) IMPLANT
SYR CONTROL 10ML LL (SYRINGE) ×2 IMPLANT
TOWEL OR 17X24 6PK STRL BLUE (TOWEL DISPOSABLE) ×3 IMPLANT
TRAY FOLEY CATH 14FR (SET/KITS/TRAYS/PACK) ×1 IMPLANT
WATER STERILE IRR 1000ML POUR (IV SOLUTION) ×2 IMPLANT

## 2010-11-06 NOTE — Progress Notes (Signed)
S: Feeling well- slept some / ctx woke her around 4:30 - regular but mild     Tolerating contractions well - no pain   O:  VS: Blood pressure 115/74, pulse 85, temperature 98 F (36.7 C), temperature source Oral, resp. rate 20, height 5\' 3"  (1.6 m), weight 95.709 kg (211 lb), last menstrual period 01/26/2010.       FHR : reactive       EFM: Category 1       Toco: contractions every 2-3 minutes / 50-60 sec mild        Cervix : same       Membranes: intact  A: Induction of labor - TOLAC  P: pitocin prime for ripening     recheck around 0900 to attempt cervical balloon placement     update MD on-call this am     [facility] 11/06/2010, 6:43 AM

## 2010-11-06 NOTE — Transfer of Care (Signed)
Immediate Anesthesia Transfer of Care Note  Patient: Rebecca Rivers  Procedure(s) Performed:  CESAREAN SECTION - Repeat cesarean section with delivery of baby girl at 15. Apgars 7/9.  Patient Location: PACU  Anesthesia Type: Spinal  Level of Consciousness: awake, alert , oriented and patient cooperative  Airway & Oxygen Therapy: Patient Spontanous Breathing  Post-op Assessment: Report given to PACU RN  Post vital signs: Reviewed and stable  Complications: No apparent anesthesia complications

## 2010-11-06 NOTE — Progress Notes (Signed)
Called 2nd to failed induction. Pt is postdates w/ hx prev LTCS for breech. Very ant cervix( under pubic bone). No change despite pitocin.  CNM care. Agree w/ proceeding with repeat C/S. The etiology for the ant presentation of cervix is unclear. Risk of C/S reviewed: infection, bleeding, injury to bladder, bowel, ureter, poss blood transfusion. All ? answered

## 2010-11-06 NOTE — Progress Notes (Signed)
S: Uncomfortable with contractions - ctx painful / laboring in bed and birth ball at intervals      Breathing with ctx - painful x 3 hours      Some show in BR  O:  VS: Blood pressure 120/69, pulse 76, temperature 97.7 F (36.5 C), temperature source Oral, resp. rate 18, height 5\' 3"  (1.6 m), weight 95.709 kg (211 lb), last menstrual period 01/26/2010.        FHR : baseline 130 / variability moderate / accels present / decels absent        EFM: Category 1        Toco: contractions every 2-3 minutes / moderate intensity  / pitocin 10 mu/min       Cervix : no change / vtx lower at -1 but cervix more anterior and funneling directly upward at 90 degree angle                     A: Failed labor induction     Previous c/s - failed TOLAC  Discussed with patient - unable to place cervical balloon or access membranes to AROM due to angle (~90 degrees) of cervix from exterior os to internal os Pitocin at 10 min/min with adequate frequency and intensity for 3 hours without any cervical change Increased risk to increase pitocin any further without cervical change - unknown etiology of cervical angling/ deflection - could be structural etiology  ie scar tissue        Increase ctx intensity without dilation would increase risk of scar dehiscence  Recommend CS at this point - patient understands rationale and agrees with management recommendation  P: D/C pitocin      Notified Dr Cherly Hensen - need for cesarean section      Preop for c-section     [facility] 11/06/2010, 3:10 PM

## 2010-11-06 NOTE — Anesthesia Procedure Notes (Addendum)
Spinal Block  Patient location during procedure: OR Start time: 11/06/2010 4:10 PM End time: 11/06/2010 4:16 PM Staffing Anesthesiologist: Sandrea Hughs Performed by: anesthesiologist  Preanesthetic Checklist Completed: patient identified, site marked, surgical consent, pre-op evaluation, timeout performed, IV checked, risks and benefits discussed and monitors and equipment checked Spinal Block Patient position: sitting Prep: DuraPrep Patient monitoring: heart rate, cardiac monitor, continuous pulse ox and blood pressure Approach: midline Location: L3-4 Injection technique: single-shot Needle Needle type: Pencan  Needle gauge: 24 G Needle length: 9 cm Needle insertion depth: 7 cm Assessment Sensory level: T4 Additional Notes The procedure took 3 attempts. I used 12 mg of Bupivicaine, 20 mcg of fentanyl, and 0.1 mg of Duramorph

## 2010-11-06 NOTE — Anesthesia Postprocedure Evaluation (Signed)
Vital signs stable Patient alert Pain and nausea are controlled No apparent anesthetic complications No follow up care needed Pt may be d/c when nm fxn of LE returns

## 2010-11-06 NOTE — Consult Note (Signed)
Delivery Note   11/06/2010  4:50 PM  Requested by Dr.  Cherly Hensen     to attend this repeat C-section.  Born to a 35  y/o G3P1 mother with PNC  O-Ab- and negative screens.          AROM at delivery with clear fluid.     The c/section delivery was uncomplicated otherwise.  Infant handed to Neo dusky but crying.  Copious secretions from mouth and nose noted which was initially bulb suctioned and eventually de Lee suctioned almost 18 ml of clear fluid.  Gave BBO2 briefly for less than a minute and infant's color improved.  APGAR 7 and 9.  Care transfer to Dr. Vaughan Basta.    Rebecca Rivers V.T. Rebecca Beaulieu, MD Neonatologist

## 2010-11-06 NOTE — OR Nursing (Signed)
Uterus massaged by S. Zamorah Ailes Charity fundraiser. Cord blood x 2 to lab.

## 2010-11-06 NOTE — Anesthesia Preprocedure Evaluation (Addendum)
Anesthesia Evaluation  Name, MR# and DOB Patient awake  General Assessment Comment  Reviewed: Allergy & Precautions, H&P  and Patient's Chart, lab work & pertinent test results  History of Anesthesia Complications (+) MALIGNANT HYPERTHERMIA  Airway Mallampati: II TM Distance: >3 FB Neck ROM: full    Dental No notable dental hx    Pulmonaryneg pulmonary ROS      pulmonary exam normal   Cardiovascular regular Normal   Neuro/PsychNegative Neurological ROS Negative Psych ROS  GI/Hepatic/Renal negative GI ROS, negative Liver ROS, and negative Renal ROS (+)       Endo/Other  Negative Endocrine ROS (+)  Morbid obesity Abdominal   Musculoskeletal  Hematology negative hematology ROS (+)   Peds  Reproductive/Obstetrics (+) Pregnancy   Anesthesia Other Findings             Anesthesia Physical Anesthesia Plan  ASA: III  Anesthesia Plan: Spinal   Post-op Pain Management:    Induction:   Airway Management Planned:   Additional Equipment:   Intra-op Plan:   Post-operative Plan:   Informed Consent: I have reviewed the patients History and Physical, chart, labs and discussed the procedure including the risks, benefits and alternatives for the proposed anesthesia with the patient or authorized representative who has indicated his/her understanding and acceptance.     Plan Discussed with: CRNA  Anesthesia Plan Comments:        Anesthesia Quick Evaluation

## 2010-11-06 NOTE — Progress Notes (Signed)
  S: Feeling well - ctx stronger but not painful     Tolerating contractions well   O:  VS: Blood pressure 120/76, pulse 85, temperature 98.1 F (36.7 C), temperature source Oral, resp. rate 20, height 5\' 3"  (1.6 m), weight 95.709 kg (211 lb), last menstrual period 01/26/2010.        FHR : baseline 155 / variability moderate / no decels         EFM: Category 1        Toco: contractions every 2-7 minutes / mild lasting 30-60 sec / good resting tone between ctx -fundus soft between ctx        Cervix : same        Membranes: intact  A: Postdates - induction of labor     Previous cs  Discussed options -                1) proceed with CS                 2) continue 4mu pitocin and recheck in 3-4 hours - place balloon as soon as feasable                3) increase pitocin some to increase ctx but do not exceed 10 mu/min - place balloon as soon as feasable       Pt desires to increase pitocin to see if progression enough to place cervical balloon at this time       Placement of cervical balloon as soon as possible - d/c pitocin after balloon & allow ambulation  Plan:  1) increase pitocin to max 10 mu/min 2) recheck cervix in 2-3 hours 3) place cervical balloon as soon as possible 4) pitocin off when balloon placed - traction every 1-2 hours on balloon until out 5) AROM when balloon out - [facility]    BAILEY,TANYA 11/06/2010, 9:31 AM

## 2010-11-06 NOTE — Brief Op Note (Signed)
11/06/2010  5:24 PM  PATIENT:  Macarthur Critchley Schremp  35 y.o. female  PRE-OPERATIVE DIAGNOSIS:  Failed Induction of labor , previous cesarean section, postdates, arrest of dilation  POST-OPERATIVE DIAGNOSIS:  failed induction of labor/previous cesarean section, postdates, arrest of dilation  PROCEDURE:  Procedure(s):Repeat CESAREAN SECTION,  Sharl Ma hysterotomy  SURGEON:  Surgeon(s): Markia Kyer Cathie Beams, MD  PHYSICIAN ASSISTANT:   ASSISTANTS: Marlinda Mike, CNM   ANESTHESIA:   spinal  ESTIMATED BLOOD LOSS: 500cc  BLOOD ADMINISTERED:none  DRAINS: none   LOCAL MEDICATIONS USED:  NONE  SPECIMEN:  No Specimen  DISPOSITION OF SPECIMEN:  N/A  COUNTS:  YES  TOURNIQUET:  * No tourniquets in log *  DICTATION #: [id number]  PLAN OF CARE: postop care  PATIENT DISPOSITION:  PACU - hemodynamically stable.   Delay start of Pharmacological VTE agent (>24hrs) due to surgical blood loss or risk of bleeding:  no  Intake/Output Summary (Last 24 hours) at 11/06/10 1724 Last data filed at 11/06/10 1650  Gross per 24 hour  Intake   1050 ml  Output    500 ml  Net    550 ml

## 2010-11-07 LAB — CBC
HCT: 30.3 % — ABNORMAL LOW (ref 36.0–46.0)
MCHC: 32.7 g/dL (ref 30.0–36.0)
MCV: 91.5 fL (ref 78.0–100.0)
RDW: 13.6 % (ref 11.5–15.5)
WBC: 7.3 10*3/uL (ref 4.0–10.5)

## 2010-11-07 MED ORDER — RHO D IMMUNE GLOBULIN 1500 UNIT/2ML IJ SOLN
300.0000 ug | Freq: Once | INTRAMUSCULAR | Status: AC
  Administered 2010-11-07: 300 ug via INTRAMUSCULAR

## 2010-11-07 MED ORDER — POLYSACCHARIDE IRON 150 MG PO CAPS
150.0000 mg | ORAL_CAPSULE | Freq: Every day | ORAL | Status: DC
  Administered 2010-11-07 – 2010-11-08 (×2): 150 mg via ORAL
  Filled 2010-11-07 (×3): qty 1

## 2010-11-07 MED ORDER — DOCUSATE SODIUM 100 MG PO CAPS: 100.0000 mg | ORAL_CAPSULE | Freq: Two times a day (BID) | ORAL | Status: DC | PRN

## 2010-11-07 MED ORDER — TETANUS-DIPHTH-ACELL PERTUSSIS 5-2-15.5 LF-MCG/0.5 IM SUSP: 0.5000 mL | Freq: Once | INTRAMUSCULAR | Status: DC

## 2010-11-07 NOTE — Op Note (Signed)
NAMEEPSIE, WALTHALL            ACCOUNT NO.:  [id number]  MEDICAL RECORD NO.:  [id number]  LOCATION:  9124                          FACILITY:  [facility]  PHYSICIAN:  Maxie Better, M.D.DATE OF BIRTH:  07-16-1975  DATE OF PROCEDURE:  11/06/2010 DATE OF DISCHARGE:                              OPERATIVE REPORT   PREOPERATIVE DIAGNOSES:  Previous cesarean section, arrest of dilatation Postdates, Failed induction  POSTOPERATIVE DIAGNOSES:  Postdates, previous cesarean section, arrest of dilatation, Failed induction  PROCEDURE:  Repeat cesarean section, Kerr hysterotomy.  ANESTHESIA:  Spinal.  SURGEON:  Maxie Better, MD  ASSISTANT:  Marlinda Mike, CNM  PROCEDURE:  Under adequate spinal anesthesia, the patient was placed in supine position with a left lateral tilt.  She was sterilely prepped and draped in usual fashion.  Indwelling Foley catheter was sterilely placed.  Marcaine 0.25% was injected along the previous Pfannenstiel skin incision.  Pfannenstiel skin incision was then made, carried down to the rectus fascia.  The rectus fascia was opened transversely. Rectus fascia was then bluntly and sharply dissected off the rectus muscle in superior and inferior fashion.  The rectus muscle was split in midline.  The parietal peritoneum was entered sharply and extended.  The vesicouterine peritoneum was opened transversely.  The bladder was then bluntly dissected off the lower uterine segment, displaced with a bladder retractor.  A curvilinear low-transverse uterine incision was then made and extended with bandage scissors.  Spontaneous rupture of membranes occurred.  Clear amniotic fluid noted.  Subsequent delivery of a live female from the left occiput anterior position was accomplished. Baby was bulb suctioned on the abdomen.  Cord was clamped, cut, and the baby was transferred to the awaiting pediatrician, who assigned Apgars of  7 and 9 at 1 and 5 minutes.  The placenta  was spontaneous, intact, not sent to Pathology.  Uterine cavity was cleaned of debris.  Uterine incision was closed in two layers, the first layer with 0 Monocryl running locked stitch, second layer was imbricated using 0 Monocryl suture.  Good hemostasis noted along the incision line.  There was a small subserosal fibroid about 1.5 cm noted on the right anterior aspect of the uterus.  Normal tubes and ovaries were then noted bilaterally. The patient was noted to have a very prominent sacral promontory, otherwise unremarkable pelvis.  The abdomen was copiously irrigated, suctioned of debris.  The parietoperitoneum was closed with 2-0 Vicryl. The rectus fascia was closed with 0 Vicryl x2.  The subcutaneous area was closed in two layers with 2-0 plain sutures.  The skin was then closed with subcuticular suturing of 4-0 Monocryl suture.  Specimen was placenta, not sent to Pathology.  Estimated blood loss 500 mL. Intraoperative fluid 1 liter.  Urine output 150 mL clear yellow urine. Sponge and instrument counts x2 was correct.  Complications none. Weight of the baby was 7 pounds 10 ounces.  The patient tolerated the procedure well, was transferred to recovery room in stable condition.     Maxie Better, M.D.     [place]  D:  11/06/2010  T:  11/07/2010  Job:  [id number]

## 2010-11-07 NOTE — Progress Notes (Signed)
  S:         Reports feeling well , some itching             Tolerating po intake / no nausea / no  vomiting / no flatus / no BM             Bleeding is light             Pain controlled withprescription NSAID's including motrin             Up ad lib / ambulatory  Newborn breast feeding  / Circumcision - not indicated   O:  A & O x 3              VS: Blood pressure 90/50, pulse 68, temperature 98.3 F (36.8 C), temperature source Oral, resp. rate 20, height 5\' 3"  (1.6 m), weight 95.709 kg (211 lb), last menstrual period 01/26/2010, SpO2 97.00%.  LABS:  Basename 11/07/10 0520 11/05/10 2322  HGB 9.9* 10.9*  HCT 30.3* 33.2*    I&O: I/O last 3 completed shifts: In: 3610 [P.O.:960; I.V.:2650] Out: 1875 [Urine:1375; Blood:500]      Lungs: Clear and unlabored  Heart: regular rate and rhythm / no mumurs  Abdomen: soft, non-tender, non-distended active BS - hypoactive             Fundus: firm, non-tender, U             Dressing intact without drainage            Perineum: no edema  Lochia: light  Extremities: no edema, no calf pain or tenderness, negative Homans  A:        POD # 1 S/P repeat c/s, failed TOLAC            Chronic anemia - iron deficiency with compounding ABL of surgery  P:        Routine postoperative care              Start iron today with colace     Hoorain Kozakiewicz 11/07/2010, 8:10 AM

## 2010-11-08 ENCOUNTER — Encounter (HOSPITAL_COMMUNITY): Payer: Self-pay | Admitting: Certified Nurse Midwife

## 2010-11-08 DIAGNOSIS — D509 Iron deficiency anemia, unspecified: Secondary | ICD-10-CM | POA: Diagnosis present

## 2010-11-08 LAB — RH IG WORKUP (INCLUDES ABO/RH)
Fetal Screen: NEGATIVE
Unit division: 0

## 2010-11-08 MED ORDER — OXYCODONE-ACETAMINOPHEN 5-325 MG PO TABS: 1.0000 | ORAL_TABLET | ORAL | Status: AC | PRN

## 2010-11-08 MED ORDER — IBUPROFEN 600 MG PO TABS: 600.0000 mg | ORAL_TABLET | Freq: Four times a day (QID) | ORAL | Status: AC | PRN

## 2010-11-08 MED ORDER — DSS 100 MG PO CAPS: 100.0000 mg | ORAL_CAPSULE | Freq: Two times a day (BID) | ORAL | Status: AC | PRN

## 2010-11-08 MED ORDER — POLYSACCHARIDE IRON 150 MG PO CAPS: 150.0000 mg | ORAL_CAPSULE | Freq: Every day | ORAL | Status: AC

## 2010-11-08 NOTE — Discharge Summary (Signed)
Patient ID: Rebecca Rivers MRN: [id number] DOB/AGE: 05/04/1975 35 y.o.  Admit date: 11/05/2010 Discharge date:  11/08/2010 Admission Diagnoses: Previous cesarean section Desires TOLAC Postdates pregnancy  Discharge Diagnoses:  Active Problems:  Postpartum care following cesarean delivery Failed TOLAC Iron deficiency anemia with ABL anemia - stable status  Prenatal history:  Gravida 3 para1 at 40 3/[redacted] weeks gestation  Prenatal care at [facility] since [redacted] weeks gestation with Marlinda Mike CNM as primary provider.   Prenatal course complicated by previous c-section, episode of threatened PTL  Prenatal Labs: ABO, Rh: O NEG (07/22 1904) Antibody: NEG (12/31 1800) Rubella:  Immune RPR: NON REACTIVE (07/20 2322)  HBsAg: Negative (07/20 0000)  HIV: Non-reactive, Non-reactive (07/20 0000)  GBS: Negative, Negative, Negative (07/20 0000)  1 hr Glucola : Normal   Medical / Surgical History : Past Medical History  Diagnosis Date  . Preterm labor    Past Surgical History  Procedure Date  . Cesarean section   . Laparoscopic cholecystectomy 2011  . Arthroscopic repair acl 2003  . Adenoidectomy 1983   Social History:  reports that she has never smoked. She does not have any smokeless tobacco history on file. She reports that she does not drink alcohol or use illicit drugs.  Allergies: Other; Neomycin; Prednisone; and Thimerosal   Current Medications at time of admission:  Prescriptions prior to admission  Medication Sig Dispense Refill  . prenatal vitamin w/FE, FA (PRENATAL 1 + 1) 27-1 MG TABS Take 1 tablet by mouth daily.        . ranitidine (ZANTAC) 75 MG tablet Take 75 mg by mouth 2 (two) times daily.            Intrapartum Course:  Admitted for IOL / TOLAC. Unsuccessful attempt to place cervical balloon due to cervical positioning. Pitocin Induction to max dose of 68mu/min with adequate ctx x 4 hours withut cervical change - still unable to place  cervical balloon. Decision made by patient to proceed with repeat c-section.   Procedures: Cesarean section delivery of female newborn by Dr Cherly Hensen  See operative report for further details  Postoperative / postpartum course: uneventful with early discharge of POD #2  Physical Exam:  VSS: Blood pressure 101/67, pulse 80, temperature 97.8 F (36.6 C), temperature source Oral, resp. rate 18, height 5\' 3"  (1.6 m), weight 95.709 kg (211 lb), last menstrual period 01/26/2010, SpO2 97.00%, unknown if currently breastfeeding.   LABS:  Lab Results  Component Value Date   HGB 9.9* 11/07/2010                             Lab Results  Component Value Date   PLT 153 11/07/2010      I&O: I/O last 3 completed shifts: In: 960 [P.O.:960] Out: 2025 [Urine:2025]      Incision:  approximated with steristrips / no erythema / no ecchymosis / no drainage  Discharge Instructions:  Postpartum Instructions: per [facility] Booklet - given to patient  Discharged Condition: stable  Diet: Regular diet with adequate water hydration.  Discharge Orders    Future Orders Please Complete By Expires   Diet - low sodium heart healthy      Discharge instructions      Comments:   [facility] booklet   Strep B DNA probe      Comments:   This external order was created through the Results Console.   Strep B DNA probe  Comments:   This external order was created through the Results Console.   Strep B DNA probe      Comments:   This external order was created through the Results Console.   HIV antibody      Comments:   This external order was created through the Results Console.   GC/chlamydia probe amp, genital      Comments:   This external order was created through the Results Console.   Rubella antibody, IgM      Comments:   This external order was created through the Results Console.   Hepatitis B surface antigen      Comments:   This external order was created through the Results  Console.   RPR      Comments:   This external order was created through the Results Console.   HIV antibody      Comments:   This external order was created through the Results Console.   GC/chlamydia probe amp, genital      Comments:   This external order was created through the Results Console.   RPR      Comments:   This external order was created through the Results Console.   ABO/Rh      Comments:   This external order was created through the Results Console.     Current Discharge Medication List    START taking these medications   Details  docusate sodium 100 MG CAPS Take 100 mg by mouth 2 (two) times daily as needed for constipation. Qty: 10 capsule, Refills: 0    ibuprofen (ADVIL,MOTRIN) 600 MG tablet Take 1 tablet (600 mg total) by mouth every 6 (six) hours as needed for pain. Qty: 30 tablet, Refills: 0    oxyCODONE-acetaminophen (PERCOCET) 5-325 MG per tablet Take 1 tablet by mouth every 4 (four) hours as needed (moderate - severe pain). Qty: 30 tablet, Refills: 0    polysaccharide iron (NIFEREX) 150 MG CAPS capsule Take 1 capsule (150 mg total) by mouth daily. Qty: 45 each, Refills: 0      CONTINUE these medications which have NOT CHANGED   Details  prenatal vitamin w/FE, FA (PRENATAL 1 + 1) 27-1 MG TABS Take 1 tablet by mouth daily.      ranitidine (ZANTAC) 75 MG tablet Take 75 mg by mouth 2 (two) times daily.        STOP taking these medications     calcium carbonate (TUMS - DOSED IN MG ELEMENTAL CALCIUM) 500 MG chewable tablet        Follow-up Information    Follow up with Krystin Keeven. Make an appointment in 6 weeks.   Contact information:   [address] [place] [address] [phone]          Disposition: Home or Self Care  Signed: Marlinda Mike 11/08/2010, 8:44 AM

## 2010-11-08 NOTE — Progress Notes (Signed)
  S:         Reports feeling well - minimal pain and analgesia effective / desires early discharge             Tolerating po intake / no nausea / no vomiting / + flatus / no BM             Bleeding is light             Pain controlled withprescription NSAID's including motrin and narcotic analgesics including percocet             Up ad lib / ambulatory  Newborn breast feeding  / Circumcision -not indicated   O:  A & O x 3              VS: Blood pressure 101/67, pulse 80, temperature 97.8 F (36.6 C), temperature source Oral, resp. rate 18, height 5\' 3"  (1.6 m), weight 95.709 kg (211 lb), last menstrual period 01/26/2010, SpO2 97.00%.  LABS:  Basename 11/07/10 0520 11/05/10 2322  HGB 9.9* 10.9*  HCT 30.3* 33.2*    I&O: I/O last 3 completed shifts: In: 960 [P.O.:960] Out: 2025 [Urine:2025]      Lungs: Clear and unlabored  Heart: regular rate and rhythm / no mumurs  Abdomen: soft, non-tender, non-distended bowel sounds active             Fundus: firm, non-tender, U             Dressing removed              Incision:  approximated with steristrips / no erythema / no ecchymosis / no drainage  Perineum: no edema  Lochia: light  Extremities: scant edema, no calf pain or tenderness, negative Homans  A:        POD # 2 S/P repeat cs            Stable status - desires early discharge  P:        Routine postoperative care              Discharge today     Cathie Bonnell 11/08/2010, 8:34 AM

## 2010-11-08 NOTE — Progress Notes (Signed)
PATIENT READY FOR DISCHARGE ANS STATES BABY IS NURSING VERY WELL.   SHE NURSED TWO PREVIOUS BABIES WITHOUT DIFFICULTY.   DISCHARGE INSTRUCTIONS GIVEN AND ENCOURAGED TO CALL LC OFFICE WITH QUESTIONS/CONCERNS.

## 2010-11-19 ENCOUNTER — Encounter (HOSPITAL_COMMUNITY): Payer: Self-pay | Admitting: *Deleted

## 2010-12-01 ENCOUNTER — Encounter (HOSPITAL_COMMUNITY): Payer: Self-pay | Admitting: Obstetrics and Gynecology

## 2011-01-21 LAB — HEMOGLOBIN AND HEMATOCRIT, BLOOD: Hemoglobin: 13.6

## 2011-01-27 LAB — COMPREHENSIVE METABOLIC PANEL
AST: 25
Albumin: 3.2 — ABNORMAL LOW
BUN: 9
Calcium: 8.9
Chloride: 104
Creatinine, Ser: 0.69
GFR calc Af Amer: >60
Total Bilirubin: 0.5
Total Protein: 7.1

## 2011-01-27 LAB — DIFFERENTIAL
Basophils Absolute: 0
Lymphocytes Relative: 11 — ABNORMAL LOW
Lymphs Abs: 1
Monocytes Absolute: 0.2
Neutro Abs: 7.9 — ABNORMAL HIGH

## 2011-01-27 LAB — CBC
HCT: 40.9
Hemoglobin: 14
MCV: 90.7
Platelets: 213
RBC: 4.5
WBC: 9.1

## 2011-01-27 LAB — URINALYSIS, ROUTINE W REFLEX MICROSCOPIC
Glucose, UA: NEGATIVE
Hgb urine dipstick: NEGATIVE
Protein, ur: NEGATIVE
pH: 6

## 2014-02-17 ENCOUNTER — Encounter (HOSPITAL_COMMUNITY): Payer: Self-pay | Admitting: Obstetrics and Gynecology

## 2014-07-27 ENCOUNTER — Encounter (HOSPITAL_COMMUNITY): Payer: Self-pay | Admitting: Emergency Medicine

## 2014-07-27 ENCOUNTER — Inpatient Hospital Stay (HOSPITAL_COMMUNITY)
Admission: EM | Admit: 2014-07-27 | Discharge: 2014-07-29 | DRG: 153 | Disposition: A | Discharge status: Home / Self Care | Payer: BLUE CROSS/BLUE SHIELD | Attending: Internal Medicine | Admitting: Internal Medicine

## 2014-07-27 ENCOUNTER — Emergency Department (HOSPITAL_COMMUNITY): Payer: BLUE CROSS/BLUE SHIELD

## 2014-07-27 DIAGNOSIS — H6092 Unspecified otitis externa, left ear: Secondary | ICD-10-CM | POA: Diagnosis present

## 2014-07-27 DIAGNOSIS — Z79899 Other long term (current) drug therapy: Secondary | ICD-10-CM | POA: Diagnosis not present

## 2014-07-27 DIAGNOSIS — D509 Iron deficiency anemia, unspecified: Secondary | ICD-10-CM | POA: Diagnosis present

## 2014-07-27 DIAGNOSIS — H70002 Acute mastoiditis without complications, left ear: Principal | ICD-10-CM | POA: Diagnosis present

## 2014-07-27 DIAGNOSIS — H9202 Otalgia, left ear: Secondary | ICD-10-CM

## 2014-07-27 DIAGNOSIS — H70009 Acute mastoiditis without complications, unspecified ear: Secondary | ICD-10-CM | POA: Diagnosis present

## 2014-07-27 DIAGNOSIS — H6090 Unspecified otitis externa, unspecified ear: Secondary | ICD-10-CM | POA: Diagnosis present

## 2014-07-27 DIAGNOSIS — K219 Gastro-esophageal reflux disease without esophagitis: Secondary | ICD-10-CM | POA: Diagnosis present

## 2014-07-27 LAB — CBC WITH DIFFERENTIAL/PLATELET
BASOS ABS: 0 10*3/uL (ref 0.0–0.1)
BASOS PCT: 0 % (ref 0–1)
Eosinophils Absolute: 0.2 10*3/uL (ref 0.0–0.7)
Eosinophils Relative: 2 % (ref 0–5)
HEMATOCRIT: 38.1 % (ref 36.0–46.0)
HEMOGLOBIN: 11.9 g/dL — AB (ref 12.0–15.0)
LYMPHS ABS: 2.1 10*3/uL (ref 0.7–4.0)
Lymphocytes Relative: 22 % (ref 12–46)
MCH: 26.2 pg (ref 26.0–34.0)
MCHC: 31.2 g/dL (ref 30.0–36.0)
MCV: 83.7 fL (ref 78.0–100.0)
Monocytes Absolute: 1.1 10*3/uL — ABNORMAL HIGH (ref 0.1–1.0)
Monocytes Relative: 12 % (ref 3–12)
NEUTROS ABS: 5.9 10*3/uL (ref 1.7–7.7)
Neutrophils Relative %: 64 % (ref 43–77)
Platelets: 262 10*3/uL (ref 150–400)
RBC: 4.55 MIL/uL (ref 3.87–5.11)
RDW: 14.5 % (ref 11.5–15.5)
WBC: 9.3 10*3/uL (ref 4.0–10.5)

## 2014-07-27 LAB — COMPREHENSIVE METABOLIC PANEL
ALBUMIN: 3.1 g/dL — AB (ref 3.5–5.2)
ALK PHOS: 98 U/L (ref 39–117)
ALT: 20 U/L (ref 0–35)
ANION GAP: 7 (ref 5–15)
AST: 23 U/L (ref 0–37)
BILIRUBIN TOTAL: 0.3 mg/dL (ref 0.3–1.2)
BUN: 11 mg/dL (ref 6–23)
CALCIUM: 9 mg/dL (ref 8.4–10.5)
CO2: 25 mmol/L (ref 19–32)
Chloride: 105 mmol/L (ref 96–112)
Creatinine, Ser: 0.74 mg/dL (ref 0.50–1.10)
GFR calc Af Amer: >90 mL/min (ref >90)
GFR calc non Af Amer: >90 mL/min (ref >90)
Glucose, Bld: 84 mg/dL (ref 70–99)
POTASSIUM: 4 mmol/L (ref 3.5–5.1)
SODIUM: 137 mmol/L (ref 135–145)
Total Protein: 7.5 g/dL (ref 6.0–8.3)

## 2014-07-27 LAB — POC URINE PREG, ED: PREG TEST UR: NEGATIVE

## 2014-07-27 MED ORDER — ACETAMINOPHEN 325 MG PO TABS: 650.0000 mg | ORAL_TABLET | Freq: Four times a day (QID) | ORAL | Status: DC | PRN

## 2014-07-27 MED ORDER — IBUPROFEN 400 MG PO TABS
800.0000 mg | ORAL_TABLET | Freq: Once | ORAL | Status: AC
  Administered 2014-07-27: 800 mg via ORAL
  Filled 2014-07-27: qty 2

## 2014-07-27 MED ORDER — ONDANSETRON HCL 4 MG PO TABS: 4.0000 mg | ORAL_TABLET | Freq: Four times a day (QID) | ORAL | Status: DC | PRN

## 2014-07-27 MED ORDER — IBUPROFEN 400 MG PO TABS
400.0000 mg | ORAL_TABLET | Freq: Four times a day (QID) | ORAL | Status: DC | PRN
  Administered 2014-07-27 – 2014-07-28 (×2): 400 mg via ORAL
  Filled 2014-07-27 (×2): qty 1

## 2014-07-27 MED ORDER — HYDROCODONE-ACETAMINOPHEN 5-325 MG PO TABS
1.0000 | ORAL_TABLET | ORAL | Status: DC | PRN
  Administered 2014-07-27 – 2014-07-28 (×4): 1 via ORAL
  Filled 2014-07-27: qty 2
  Filled 2014-07-27 (×3): qty 1

## 2014-07-27 MED ORDER — MORPHINE SULFATE 2 MG/ML IJ SOLN: 2.0000 mg | INTRAMUSCULAR | Status: DC | PRN

## 2014-07-27 MED ORDER — ONDANSETRON HCL 4 MG/2ML IJ SOLN: 4.0000 mg | Freq: Four times a day (QID) | INTRAMUSCULAR | Status: DC | PRN

## 2014-07-27 MED ORDER — POTASSIUM CHLORIDE IN NACL 20-0.9 MEQ/L-% IV SOLN
INTRAVENOUS | Status: DC
  Administered 2014-07-27 – 2014-07-28 (×2): via INTRAVENOUS
  Filled 2014-07-27 (×2): qty 1000

## 2014-07-27 MED ORDER — FAMOTIDINE 20 MG PO TABS
40.0000 mg | ORAL_TABLET | Freq: Every day | ORAL | Status: DC
  Administered 2014-07-27 – 2014-07-28 (×2): 40 mg via ORAL
  Filled 2014-07-27 (×2): qty 2

## 2014-07-27 MED ORDER — ALUM & MAG HYDROXIDE-SIMETH 200-200-20 MG/5ML PO SUSP: 30.0000 mL | Freq: Four times a day (QID) | ORAL | Status: DC | PRN

## 2014-07-27 MED ORDER — SODIUM CHLORIDE 0.9 % IV SOLN
3.0000 g | Freq: Four times a day (QID) | INTRAVENOUS | Status: DC
  Administered 2014-07-27 – 2014-07-29 (×7): 3 g via INTRAVENOUS
  Filled 2014-07-27 (×9): qty 3

## 2014-07-27 MED ORDER — HEPARIN SODIUM (PORCINE) 5000 UNIT/ML IJ SOLN
5000.0000 [IU] | Freq: Three times a day (TID) | INTRAMUSCULAR | Status: DC
  Administered 2014-07-27 – 2014-07-29 (×5): 5000 [IU] via SUBCUTANEOUS
  Filled 2014-07-27 (×5): qty 1

## 2014-07-27 MED ORDER — GUAIFENESIN-DM 100-10 MG/5ML PO SYRP: 5.0000 mL | ORAL_SOLUTION | ORAL | Status: DC | PRN

## 2014-07-27 MED ORDER — CIPROFLOXACIN-DEXAMETHASONE 0.3-0.1 % OT SUSP
4.0000 [drp] | Freq: Two times a day (BID) | OTIC | Status: DC
  Administered 2014-07-27 – 2014-07-29 (×4): 4 [drp] via OTIC
  Filled 2014-07-27: qty 7.5

## 2014-07-27 MED ORDER — ACETAMINOPHEN 650 MG RE SUPP: 650.0000 mg | Freq: Four times a day (QID) | RECTAL | Status: DC | PRN

## 2014-07-27 NOTE — ED Notes (Signed)
Dr Singh at bedside

## 2014-07-27 NOTE — Progress Notes (Addendum)
ANTIBIOTIC CONSULT NOTE - INITIAL  Pharmacy Consult for Unasyn Indication: ear infection, possible mastoiditis  Allergies  Allergen Reactions  . Other Rash    BLUE ULTRASOUND GEL  . Neomycin Rash  . Prednisone Swelling and Rash  . Thimerosal Swelling and Rash    Patient Measurements: Height: 5\' 4"  (162.6 cm) Weight: 212 lb (96.163 kg) IBW/kg (Calculated) : 54.7  Vital Signs: Temp: 98 F (36.7 C) (04/10 1725) Temp Source: Oral (04/10 1725) BP: 154/98 mmHg (04/10 1725) Pulse Rate: 89 (04/10 1725) Intake/Output from previous day:   Intake/Output from this shift:    Labs:  Recent Labs  07/27/14 1359  WBC 9.3  HGB 11.9*  PLT 262  CREATININE 0.74   Estimated Creatinine Clearance: 107.3 mL/min (by C-G formula based on Cr of 0.74). No results for input(s): VANCOTROUGH, VANCOPEAK, VANCORANDOM, GENTTROUGH, GENTPEAK, GENTRANDOM, TOBRATROUGH, TOBRAPEAK, TOBRARND, AMIKACINPEAK, AMIKACINTROU, AMIKACIN in the last 72 hours.   Microbiology: No results found for this or any previous visit (from the past 720 hour(s)).  Medical History: Past Medical History  Diagnosis Date  . Preterm labor     Medications:  See PTA med list  Assessment: 39 y/o female who presented to the ED with worsened L ear pain. She was prescribed Omnicef and Ciprodex 3 days ago with no improvement. Pharmacy consulted to begin Unasyn for an ear infection and possible mastoiditis. She is afebrile, WBC are normal, and renal function is normal.  Goal of Therapy:  Eradication of infection  Plan:  - Unasyn 3 g IV q6h - Monitor renal function and f/u clinical progress  East Bay Endoscopy CenterJennifer Grill, 17[address] Rainbow BoulevardPharm.D., BCPS Clinical Pharmacist Pager: [phone] 07/27/2014 5:47 PM    ================   Pharmacy will sign off.  Thanks!  Katiana Ruland D. Laney Potashang, PharmD, BCPS Pager:  [phone] 07/28/2014, 1:57 PM

## 2014-07-27 NOTE — ED Notes (Signed)
Phlebotomy at bedside drawing cultures. 

## 2014-07-27 NOTE — ED Provider Notes (Signed)
CSN: [id number]     Arrival date & time 07/27/14  1004 History  This chart was scribed for non-physician practitioner, Raymon Mutton, PA-C, working with Glynn Octave, MD, by Ronney Lion, ED Scribe. This patient was seen in room TR10C/TR10C and the patient's care was started at 12:51 PM.    Chief Complaint  Patient presents with  . Otalgia   The history is provided by the patient. No language interpreter was used.     HPI Comments: Rebecca Rivers is a 39 y.o. female with a past medical history of pre-term labor who presents to the Emergency Department complaining of constant left otalgia that began 3 days ago but which worsened over the past 2 days. She originally saw an Urgent Care provider 3 days ago who made a diagnosis of otitis media and gave her omnicef and Ciprodex drops, but she had taken both for 3 days with no relief. Patient then went to another Urgent Care clinic ([facility]) this morning and was sent here because the provider was unable to visualize the inside of her ear. Patient states she had some purulent drainage from the ear when the provider cleaned her ear, but has otherwise not noticed any drainage. She also reports associated fever that she noticed on Thursday that has now resolved, muffled hearing that is gradually worsening, lightheadedness with standing, jaw pain, and feeling unable to close her jaw to make her teeth touch due to swelling in her jaw. Patient reports a history of otitis media as a child. Patient states she went to [employer] last week and rode water rides, but denies submerging her head underwater. She denies a history of ear tube placement. She also denies difficulty swallowing, sore throat, nasal congestion, neck pain, neck stiffness, blurred vision, sudden loss of vision, tinnitus, dizziness, syncope, nausea, vomiting, chest pain, SOB, difficulty breathing, or any throat-closing sensation. PCP Dr. Maurice Small   Past Medical History   Diagnosis Date  . Preterm labor    Past Surgical History  Procedure Laterality Date  . Cesarean section    . Laparoscopic cholecystectomy  2011  . Arthroscopic repair acl  2003  . Adenoidectomy  1983  . Cesarean section  11/06/2010    Procedure: CESAREAN SECTION;  Surgeon: Serita Kyle, MD;  Location: [facility];  Service: Gynecology;  Laterality: N/A;  Repeat cesarean section with delivery of baby girl at 61. Apgars 7/9.   Family History  Problem Relation Age of Onset  . Diabetes Father    History  Substance Use Topics  . Smoking status: Never Smoker   . Smokeless tobacco: Not on file  . Alcohol Use: No   OB History    Gravida Para Term Preterm AB TAB SAB Ectopic Multiple Living   0 1 0 1 0 0 2     Review of Systems  Constitutional: Positive for fever.  HENT: Positive for ear pain. Negative for congestion, sore throat, tinnitus and trouble swallowing.   Eyes: Negative for visual disturbance.  Respiratory: Negative for shortness of breath.   Cardiovascular: Negative for chest pain.  Gastrointestinal: Negative for nausea and vomiting.  Musculoskeletal: Negative for neck pain and neck stiffness.  Neurological: Negative for dizziness and syncope.      Allergies  Other; Neomycin; Prednisone; and Thimerosal  Home Medications   Prior to Admission medications   Medication Sig Start Date End Date Taking? Authorizing Provider  cefdinir (OMNICEF) 300 MG capsule Take 300 mg by  mouth 2 (two) times daily. 07/24/14 08/03/14 Yes Historical Provider, MD  ciprofloxacin-dexamethasone (CIPRODEX) otic suspension Place 4 drops into the left ear 2 (two) times daily. 07/24/14 08/03/14 Yes Historical Provider, MD  fexofenadine (ALLEGRA) 180 MG tablet Take 180 mg by mouth daily.   Yes Historical Provider, MD  ranitidine (ZANTAC) 75 MG tablet Take 75 mg by mouth 2 (two) times daily as needed for heartburn.    Yes Historical Provider, MD  polysaccharide iron (NIFEREX) 150 MG CAPS  capsule Take 1 capsule (150 mg total) by mouth daily. 11/08/10   Marlinda Mikeanya Bailey, CNM   BP 175/102 mmHg  Pulse 96  Temp(Src) 98.3 F (36.8 C) (Oral)  Resp 16  Ht 5\' 4"  (1.626 m)  Wt 212 lb (96.163 kg)  BMI 36.37 kg/m2  SpO2 99%  LMP 07/11/2014  Breastfeeding? No Physical Exam  Constitutional: She is oriented to person, place, and time. She appears well-developed and well-nourished. No distress.  HENT:  Head: Normocephalic and atraumatic.  Right Ear: Hearing, tympanic membrane, external ear and ear canal normal.  Left Ear: There is tenderness. No drainage or swelling. There is mastoid tenderness.  No middle ear effusion. Decreased hearing is noted.  Mouth/Throat: Oropharynx is clear and moist. No oropharyngeal exudate.  Left ear:Pre-and postauricular swelling to the left ureter identified with tenderness upon palpation. Post auricular erythema identified with tenderness upon palpation to the mastoid. Negative external swelling. Ear canal identified to have swelling and mild milky discharge-appears to have otitis externa. TM cannot really be seen.  Eyes: Conjunctivae and EOM are normal. Pupils are equal, round, and reactive to light. Right eye exhibits no discharge. Left eye exhibits no discharge.  Neck: Normal range of motion. Neck supple. No tracheal deviation present.  Cardiovascular: Normal rate, regular rhythm and normal heart sounds.  Exam reveals no friction rub.   No murmur heard. Pulses:      Radial pulses are 2+ on the right side, and 2+ on the left side.  Pulmonary/Chest: Effort normal and breath sounds normal. No respiratory distress. She has no wheezes. She has no rales.  Musculoskeletal: Normal range of motion.  Lymphadenopathy:    She has no cervical adenopathy.  Neurological: She is alert and oriented to person, place, and time. No cranial nerve deficit. She exhibits normal muscle tone. Coordination normal.  Skin: Skin is warm and dry. No rash noted. She is not diaphoretic.  No erythema.  Psychiatric: She has a normal mood and affect. Her behavior is normal. Thought content normal.  Vitals reviewed.   ED Course  Procedures (including critical care time)  DIAGNOSTIC STUDIES: Oxygen Saturation is 98% on room air, normal by my interpretation.    COORDINATION OF CARE: 1:00 PM - Discussed treatment plan with pt at bedside which includes consulting with attending physician, and pt agreed to plan.  Results for orders placed or performed during the hospital encounter of 07/27/14  CBC with Differential/Platelet  Result Value Ref Range   WBC 9.3 4.0 - 10.5 K/uL   RBC 4.55 3.87 - 5.11 MIL/uL   Hemoglobin 11.9 (L) 12.0 - 15.0 g/dL   HCT 82.938.1 56.236.0 - 13.046.0 %   MCV 83.7 78.0 - 100.0 fL   MCH 26.2 26.0 - 34.0 pg   MCHC 31.2 30.0 - 36.0 g/dL   RDW 86.514.5 78.411.5 - 69.615.5 %   Platelets 262 150 - 400 K/uL   Neutrophils Relative % 64 43 - 77 %   Neutro Abs 5.9 1.7 - 7.7 K/uL  Lymphocytes Relative 22 12 - 46 %   Lymphs Abs 2.1 0.7 - 4.0 K/uL   Monocytes Relative 12 3 - 12 %   Monocytes Absolute 1.1 (H) 0.1 - 1.0 K/uL   Eosinophils Relative 2 0 - 5 %   Eosinophils Absolute 0.2 0.0 - 0.7 K/uL   Basophils Relative 0 0 - 1 %   Basophils Absolute 0.0 0.0 - 0.1 K/uL  Comprehensive metabolic panel  Result Value Ref Range   Sodium 137 135 - 145 mmol/L   Potassium 4.0 3.5 - 5.1 mmol/L   Chloride 105 96 - 112 mmol/L   CO2 25 19 - 32 mmol/L   Glucose, Bld 84 70 - 99 mg/dL   BUN 11 6 - 23 mg/dL   Creatinine, Ser 1.61 0.50 - 1.10 mg/dL   Calcium 9.0 8.4 - 09.6 mg/dL   Total Protein 7.5 6.0 - 8.3 g/dL   Albumin 3.1 (L) 3.5 - 5.2 g/dL   AST 23 0 - 37 U/L   ALT 20 0 - 35 U/L   Alkaline Phosphatase 98 39 - 117 U/L   Total Bilirubin 0.3 0.3 - 1.2 mg/dL   GFR calc non Af Amer >90 >90 mL/min   GFR calc Af Amer >90 >90 mL/min   Anion gap 7 5 - 15  POC urine preg, ED (not at [facility])  Result Value Ref Range   Preg Test, Ur NEGATIVE NEGATIVE     Labs Review Labs Reviewed  CBC  WITH DIFFERENTIAL/PLATELET - Abnormal; Notable for the following:    Hemoglobin 11.9 (*)    Monocytes Absolute 1.1 (*)    All other components within normal limits  COMPREHENSIVE METABOLIC PANEL - Abnormal; Notable for the following:    Albumin 3.1 (*)    All other components within normal limits  CULTURE, BLOOD (ROUTINE X 2)  CULTURE, BLOOD (ROUTINE X 2)  POC URINE PREG, ED    Imaging Review Ct Temporal Bones W/o Cm  07/27/2014   CLINICAL DATA:  39 year old female with left ear pain for past week. On antibiotics. Pain has increased over the past 2 days. Initial encounter.  EXAM: CT TEMPORAL BONES WITHOUT CONTRAST  TECHNIQUE: Axial and coronal plane CT imaging of the petrous temporal bones was performed with thin-collimation image reconstruction. No intravenous contrast was administered. Multiplanar CT image reconstructions were also generated.  COMPARISON:  None.  FINDINGS: Opacification of portions of the left middle ear cavity surrounding the ossicles slightly limiting evaluation of the stapes and adjacent lenticular process but without clear destruction of ossicles. There is however, minimal blunting of the scutum and therefore cholesteatoma cannot be excluded. Opacification left sinus tympani.  Bone cover of the horizontal portion of the left seventh cranial nerve is thin or dehiscent.  Roof of the left middle ear cavity is thin or partially dehiscent.  Partial opacification left mastoid air cells without loss is septations to suggest coalescing mastoiditis.  Soft tissue prominence along the left external auditory canal greater superiorly. This may reflect changes of external otitis.  Labyrinthine structures are normal and symmetric bilaterally.  Minimal partial opacification right mastoid air cells. Right middle ear is clear.  No obstructing lesion of the eustachian tube is noted.  Incidentally noted are small parotid lymph nodes bilaterally.  Visualized intracranial structures unremarkable.  Visualized paranasal sinuses are clear.  IMPRESSION: Opacification of portions of the left middle ear cavity as noted above with minimal blunting of the scutum and therefore cholesteatoma cannot be excluded.  Bone cover  of the horizontal portion of the left seventh cranial nerve is thin or dehiscent.  Roof of the left middle ear cavity is thin or partially dehiscent.  Partial opacification left mastoid air cells without loss is septations to suggest coalescing mastoiditis.  Soft tissue prominence along the left external auditory canal greater superiorly. This may reflect changes of external otitis (is the patient diabetic raising the possibility of malignant otitis externa).  Minimal partial opacification right mastoid air cells. Right middle ear is clear.   Electronically Signed   By: Lacy Duverney M.D.   On: 07/27/2014 16:29     EKG Interpretation None       4:40 PM Dr. Manus Gunning at bedside assessing patient.   5:31 PM This provider discussed case in great detail with Dr. Suszanne Conners, in nose and throat physician. Discussed case, labs, imaging in great detail. Dr. Suszanne Conners reviewed patient's CT temporal bones. As per ENT physician, recommended patient to be admitted for IV antibiotics. Recommended IV Unasyn to be administered. Dr. Suszanne Conners to see patient.  5:42 PM This provider discussed case in great detail with Dr. Thedore Mins, [facility]. Discussed case, labs, imaging ENT recommendation great detail. As per admitting physician, recommended blood cultures 2 to be obtained. Patient to be admitted to MedSurg.   MDM   Final diagnoses:  Otitis externa, left  Left ear pain    Medications  Ampicillin-Sulbactam (UNASYN) 3 g in sodium chloride 0.9 % 100 mL IVPB (not administered)  ciprofloxacin-dexamethasone (CIPRODEX) 0.3-0.1 % otic suspension 4 drop (not administered)  ibuprofen (ADVIL,MOTRIN) tablet 800 mg (800 mg Oral Given 07/27/14 1345)    Filed Vitals:   07/27/14 1027 07/27/14 1514 07/27/14 1725  07/27/14 1829  BP: 160/87 146/87 154/98 175/102  Pulse: 94 86 89 96  Temp: 98.3 F (36.8 C) 98.4 F (36.9 C) 98 F (36.7 C) 98.3 F (36.8 C)  TempSrc: Oral Oral Oral Oral  Resp: 18 20 20 16   Height: 5\' 4"  (1.626 m)     Weight: 212 lb (96.163 kg)     SpO2: 98% 100% 98% 99%   I personally performed the services described in this documentation, which was scribed in my presence. The recorded information has been reviewed and is accurate.  CBC negative elevated white cell count. Hemoglobin 11.9, hematocrit 38.1. CMP unremarkable. Urine pregnancy negative. CT temporal bones noted opacification of portions of the left middle ear cavity as noted above with minimal blunting of the scutum, therefore cholesteatoma cannot be excluded. Bone cover the horizontal portion of the seventh cranial nerve is thin and dehiscent. Roof of the left middle ear cavity is thin and partially dehiscent. Partial opacification of the left mastoid air cells without loss is septations to suggest possible mastoiditis. Soft tissue prominence along the left external auditory canal greater superiorly-may reflect changes of external otitis. Minimal partial opacification right mastoid air cells, right middle ears clear. Patient presented to the ED with worsening ear infection despite by mouth antibiotics and medicated eardrops - patient failed outpatient treatment. CT temporal bone cannot rule out possible beginnings of mastoiditis. Dr. Suszanne Conners, ENT physician, consulted who recommended patient to be admitted for IV antibiotics. Patient to be admitted to [facility], discussed case in great detail. Patient to be admitted to MedSurg floor for IV antibiotics. ENT consult. Discussed plan for admission with patient who is in accordance to plan. Patient stable to transfer to floor.  Raymon Mutton, PA-C 07/27/14 1840  Glynn Octave, MD 07/27/14 [id number]

## 2014-07-27 NOTE — H&P (Signed)
Patient Demographics  Rebecca Rivers, is a 39 y.o. female  MRN: [id number]   DOB - [birth year]  Admit Date - 07/27/2014  Outpatient Primary MD for the patient is Astrid Divine, MD   With History of -  Past Medical History  Diagnosis Date  . Preterm labor       Past Surgical History  Procedure Laterality Date  . Cesarean section    . Laparoscopic cholecystectomy  2011  . Arthroscopic repair acl  2003  . Adenoidectomy  1983  . Cesarean section  11/06/2010    Procedure: CESAREAN SECTION;  Surgeon: Serita Kyle, MD;  Location: [facility];  Service: Gynecology;  Laterality: N/A;  Repeat cesarean section with delivery of baby girl at 36. Apgars 7/9.    in for   Chief Complaint  Patient presents with  . Otalgia     HPI  Rebecca Rivers  is a 39 y.o. female, who is in reasonable state of health, besides GERD no other health issues, started having left earache since last Thursday 3 days ago, went to urgent care and was placed on oral cephalosporin without much benefit, she started experiencing low-grade fevers and some hearing loss in the left ear, her pain is throbbing, constant, no aggravating factors but relieved with Motrin. Associated with mild hearing loss. She has not noticed any discharge. No preceding illness except some seasonal allergies. No exposure to sick contacts. He came to the ER where workup including head CT scan was suggestive of left-sided acute mastoiditis. Her case was discussed with ENT physician Dr.Teoh who requested hospitalist to admit.  She denies any meningismus signs. No headache, no photophobia, no neck stiffness. No other systemic complaints.    Review of Systems    In addition to the HPI above,   No Fever-chills, No Headache, No changes with Vision , except  left ear pain and loss of hearing as above No problems swallowing food or Liquids, No Chest pain, Cough or Shortness of Breath, No Abdominal pain, No Nausea or Vommitting, Bowel movements are regular, No Blood in stool or Urine, No dysuria, No new skin rashes or bruises, No new joints pains-aches,  No new weakness, tingling, numbness in any extremity, No recent weight gain or loss, No polyuria, polydypsia or polyphagia, No significant Mental Stressors.  A full 10 point Review of Systems was done, except as stated above, all other Review of Systems were negative.   Social History History  Substance Use Topics  . Smoking status: Never Smoker   . Smokeless tobacco: Not on file  . Alcohol Use: No      Family History Family History  Problem Relation Age of Onset  . Diabetes Father       Prior to Admission medications   Medication Sig Start Date End Date Taking? Authorizing Provider  polysaccharide iron (NIFEREX) 150 MG CAPS capsule Take 1 capsule (150 mg total) by mouth daily. 11/08/10  Marlinda Mikeanya Bailey, CNM  prenatal vitamin w/FE, FA (PRENATAL 1 + 1) 27-1 MG TABS Take 1 tablet by mouth daily.      Historical Provider, MD  ranitidine (ZANTAC) 75 MG tablet Take 75 mg by mouth 2 (two) times daily.      Historical Provider, MD    Allergies  Allergen Reactions  . Other Rash    BLUE ULTRASOUND GEL  . Neomycin Rash  . Prednisone Swelling and Rash  . Thimerosal Swelling and Rash    Physical Exam  Vitals  Blood pressure 154/98, pulse 89, temperature 98 F (36.7 C), temperature source Oral, resp. rate 20, height 5\' 4"  (1.626 m), weight 96.163 kg (212 lb), last menstrual period 07/11/2014, SpO2 98 %, not currently breastfeeding.   1. General young obese white female lying in bed in NAD,     2. Normal affect and insight, Not Suicidal or Homicidal, Awake Alert, Oriented X 3.  3. No F.N deficits, ALL C.Nerves Intact, Strength 5/5 all 4 extremities, Sensation intact all 4  extremities, Plantars down going.  4. Eyes appear Normal, Conjunctivae clear, PERRLA. Moist Oral Mucosa. Right ear unremarkable, left ear mildly tender behind the mastoid, still has intact light reflex, no tympanic membrane rupture or pus noted in the ear cavity.  5. Supple Neck, No JVD, No cervical lymphadenopathy appriciated, No Carotid Bruits.  6. Symmetrical Chest wall movement, Good air movement bilaterally, CTAB.  7. RRR, No Gallops, Rubs or Murmurs, No Parasternal Heave.  8. Positive Bowel Sounds, Abdomen Soft, No tenderness, No organomegaly appriciated,No rebound -guarding or rigidity.  9.  No Cyanosis, Normal Skin Turgor, No Skin Rash or Bruise.  10. Good muscle tone,  joints appear normal , no effusions, Normal ROM.  11. No Palpable Lymph Nodes in Neck or Axillae     Data Review  CBC  Recent Labs Lab 07/27/14 1359  WBC 9.3  HGB 11.9*  HCT 38.1  PLT 262  MCV 83.7  MCH 26.2  MCHC 31.2  RDW 14.5  LYMPHSABS 2.1  MONOABS 1.1*  EOSABS 0.2  BASOSABS 0.0   ------------------------------------------------------------------------------------------------------------------  Chemistries   Recent Labs Lab 07/27/14 1359  NA 137  K 4.0  CL 105  CO2 25  GLUCOSE 84  BUN 11  CREATININE 0.74  CALCIUM 9.0  AST 23  ALT 20  ALKPHOS 98  BILITOT 0.3   ------------------------------------------------------------------------------------------------------------------ estimated creatinine clearance is 107.3 mL/min (by C-G formula based on Cr of 0.74). ------------------------------------------------------------------------------------------------------------------ No results for input(s): TSH, T4TOTAL, T3FREE, THYROIDAB in the last 72 hours.  Invalid input(s): FREET3   Coagulation profile No results for input(s): INR, PROTIME in the last 168 hours. ------------------------------------------------------------------------------------------------------------------- No  results for input(s): DDIMER in the last 72 hours. -------------------------------------------------------------------------------------------------------------------  Cardiac Enzymes No results for input(s): CKMB, TROPONINI, MYOGLOBIN in the last 168 hours.  Invalid input(s): CK ------------------------------------------------------------------------------------------------------------------ Invalid input(s): POCBNP   ---------------------------------------------------------------------------------------------------------------  Urinalysis    Component Value Date/Time   COLORURINE YELLOW 01/29/2007 0125   APPEARANCEUR CLEAR 01/29/2007 0125   LABSPEC 1.012 01/29/2007 0125   PHURINE 6.0 01/29/2007 0125   GLUCOSEU NEGATIVE 01/29/2007 0125   HGBUR NEGATIVE 01/29/2007 0125   BILIRUBINUR NEGATIVE 01/29/2007 0125   KETONESUR NEGATIVE 01/29/2007 0125   PROTEINUR NEGATIVE 01/29/2007 0125   UROBILINOGEN 0.2 01/29/2007 0125   NITRITE NEGATIVE 01/29/2007 0125   LEUKOCYTESUR  01/29/2007 0125    NEGATIVE MICROSCOPIC NOT DONE ON URINES WITH NEGATIVE PROTEIN, BLOOD, LEUKOCYTES, NITRITE, OR GLUCOSE <1000 mg/dL.    ----------------------------------------------------------------------------------------------------------------  Imaging results:  Ct Temporal Bones W/o Cm  07/27/2014   CLINICAL DATA:  39 year old female with left ear pain for past week. On antibiotics. Pain has increased over the past 2 days. Initial encounter.  EXAM: CT TEMPORAL BONES WITHOUT CONTRAST  TECHNIQUE: Axial and coronal plane CT imaging of the petrous temporal bones was performed with thin-collimation image reconstruction. No intravenous contrast was administered. Multiplanar CT image reconstructions were also generated.  COMPARISON:  None.  FINDINGS: Opacification of portions of the left middle ear cavity surrounding the ossicles slightly limiting evaluation of the stapes and adjacent lenticular process but without  clear destruction of ossicles. There is however, minimal blunting of the scutum and therefore cholesteatoma cannot be excluded. Opacification left sinus tympani.  Bone cover of the horizontal portion of the left seventh cranial nerve is thin or dehiscent.  Roof of the left middle ear cavity is thin or partially dehiscent.  Partial opacification left mastoid air cells without loss is septations to suggest coalescing mastoiditis.  Soft tissue prominence along the left external auditory canal greater superiorly. This may reflect changes of external otitis.  Labyrinthine structures are normal and symmetric bilaterally.  Minimal partial opacification right mastoid air cells. Right middle ear is clear.  No obstructing lesion of the eustachian tube is noted.  Incidentally noted are small parotid lymph nodes bilaterally.  Visualized intracranial structures unremarkable. Visualized paranasal sinuses are clear.  IMPRESSION: Opacification of portions of the left middle ear cavity as noted above with minimal blunting of the scutum and therefore cholesteatoma cannot be excluded.  Bone cover of the horizontal portion of the left seventh cranial nerve is thin or dehiscent.  Roof of the left middle ear cavity is thin or partially dehiscent.  Partial opacification left mastoid air cells without loss is septations to suggest coalescing mastoiditis.  Soft tissue prominence along the left external auditory canal greater superiorly. This may reflect changes of external otitis (is the patient diabetic raising the possibility of malignant otitis externa).  Minimal partial opacification right mastoid air cells. Right middle ear is clear.   Electronically Signed   By: Lacy Duverney M.D.   On: 07/27/2014 16:29         Assessment & Plan   1. Acute left-sided mastoiditis. Blood cultures drawn, will be admitted, have discussed the case with ENT physician Dr.Teoh will be placed on IV Unasyn, he requests to continue Ciprodex eardrops  twice a day, he will evaluate the patient in the morning. She likely will not require surgery, likely 2 days of IV antibiotics then oral Augmentin.   2. GERD. Continue PPI.    3. HX of iron deficiency anemia. No acute issues. Monitor.     DVT Prophylaxis Heparin    AM Labs Ordered, also please review Full Orders  Family Communication: Admission, patients condition and plan of care including tests being ordered have been discussed with the patient and husband who indicate understanding and agree with the plan and Code Status.  Code Status Full  Likely DC to  Home  Condition Fair  Time spent in minutes : 35    SINGH,PRASHANT K M.D on 07/27/2014 at 6:00 PM  Between 7am to 7pm - Pager - [phone]  After 7pm go to [email or web address] - password [email or web address]  [facility]  Office  [phone]

## 2014-07-27 NOTE — ED Notes (Signed)
Pt. Stated, [facility].  Went this morning and was sent here.

## 2014-07-28 DIAGNOSIS — H6092 Unspecified otitis externa, left ear: Secondary | ICD-10-CM

## 2014-07-28 LAB — CBC
HCT: 33.7 % — ABNORMAL LOW (ref 36.0–46.0)
Hemoglobin: 10.5 g/dL — ABNORMAL LOW (ref 12.0–15.0)
MCH: 26.2 pg (ref 26.0–34.0)
MCHC: 31.2 g/dL (ref 30.0–36.0)
MCV: 84 fL (ref 78.0–100.0)
PLATELETS: 223 10*3/uL (ref 150–400)
RBC: 4.01 MIL/uL (ref 3.87–5.11)
RDW: 14.6 % (ref 11.5–15.5)
WBC: 8 10*3/uL (ref 4.0–10.5)

## 2014-07-28 LAB — BASIC METABOLIC PANEL
Anion gap: 6 (ref 5–15)
BUN: 10 mg/dL (ref 6–23)
CHLORIDE: 106 mmol/L (ref 96–112)
CO2: 25 mmol/L (ref 19–32)
CREATININE: 0.69 mg/dL (ref 0.50–1.10)
Calcium: 8.1 mg/dL — ABNORMAL LOW (ref 8.4–10.5)
GFR calc non Af Amer: >90 mL/min (ref >90)
Glucose, Bld: 89 mg/dL (ref 70–99)
Potassium: 4 mmol/L (ref 3.5–5.1)
Sodium: 137 mmol/L (ref 135–145)

## 2014-07-28 LAB — GLUCOSE, CAPILLARY: Glucose-Capillary: 102 mg/dL — ABNORMAL HIGH (ref 70–99)

## 2014-07-28 MED ORDER — IBUPROFEN 600 MG PO TABS
600.0000 mg | ORAL_TABLET | Freq: Four times a day (QID) | ORAL | Status: DC | PRN
  Administered 2014-07-28 – 2014-07-29 (×4): 600 mg via ORAL
  Filled 2014-07-28 (×4): qty 1

## 2014-07-28 NOTE — Progress Notes (Signed)
[facility]                                                                              Patient Demographics  Rebecca Rivers, is a 39 y.o. female, DOB - 09-19-1975, WUJ:[id number]  Admit date - 07/27/2014   Admitting Physician Leroy SeaPrashant K Singh, MD  Outpatient Primary MD for the patient is Astrid DivineGRIFFIN,ELAINE COLLINS, MD  LOS - 1   Chief Complaint  Patient presents with  . Otalgia      HPI on 07/27/2014 by Dr. Susa RaringPrashant Singh Rebecca SemenHeather Rivers is a 39 y.o. female, who is in reasonable state of health, besides GERD no other health issues, started having left earache since last Thursday 3 days ago, went to urgent care and was placed on oral cephalosporin without much benefit, she started experiencing low-grade fevers and some hearing loss in the left ear, her pain is throbbing, constant, no aggravating factors but relieved with Motrin. Associated with mild hearing loss. She has not noticed any discharge. No preceding illness except some seasonal allergies. No exposure to sick contacts. He came to the ER where workup including head CT scan was suggestive of left-sided acute mastoiditis. Her case was discussed with ENT physician Dr.Teoh who requested hospitalist to admit. She denies any meningismus signs. No headache, no photophobia, no neck stiffness. No other systemic complaints.  Assessment & Plan   Acute left otitis externa/media -Failed outpatient antibiotics -Continue IV unaysn -ENT, Dr. Suszanne Connerseoh consulted and appreicated, recommended IV antibiotics for an additional day, with discharge on  Augmentin and Ciprodex drops, with outpatient followup  GERD -Continue pepcid  Iron deficiency anemia -Hb currently stable, small drop likely secondary to IVF -Continue to monitor CBC  Code Status: Full  Family Communication: None at bedside  Disposition Plan: Admitted, continue IV antibiotics  Time Spent in minutes   30 minutes  Procedures  None  Consults   ENT, Dr. Suszanne Connerseoh  DVT  Prophylaxis  Heparin  Lab Results  Component Value Date   PLT 223 07/28/2014    Medications  Scheduled Meds: . ampicillin-sulbactam (UNASYN) IV  3 g Intravenous Q6H  . ciprofloxacin-dexamethasone  4 drop Left Ear BID  . famotidine  40 mg Oral Q supper  . heparin  5,000 Units Subcutaneous 3 times per day   Continuous Infusions: . 0.9 % NaCl with KCl 20 mEq / L 75 mL/hr at 07/28/14 1030   PRN Meds:.acetaminophen **OR** acetaminophen, alum & mag hydroxide-simeth, guaiFENesin-dextromethorphan, HYDROcodone-acetaminophen, ibuprofen, morphine injection, ondansetron **OR** ondansetron (ZOFRAN) IV  Antibiotics    Anti-infectives    Start     Dose/Rate Route Frequency Ordered Stop   07/27/14 1800  Ampicillin-Sulbactam (UNASYN) 3 g in sodium chloride 0.9 % 100 mL IVPB     3 g 100 mL/hr over 60 Minutes Intravenous Every 6 hours 07/27/14 1744        Subjective:   Arianne Rhudy seen and examined today. Patient feels her pain has improved.  She denies any problems swallowing, speaking, chewing. Does state she cannot close her mouth completely. Feels that the pain has improved however continues to have some tenderness along the left jawline and ear as well as neck. Currently denies any chest pain,  shortness of breath, dizziness, headache, abdominal pain.  Objective:   Filed Vitals:   07/27/14 1829 07/27/14 1920 07/27/14 2252 07/28/14 0534  BP: 175/102 183/98 144/82 130/80  Pulse: 96 94 81 76  Temp: 98.3 F (36.8 C) 98.6 F (37 C) 98.5 F (36.9 C) 98.2 F (36.8 C)  TempSrc: Oral Oral Oral   Resp: Height:   (1.626 m)    Weight:  95.255 kg (210 lb)    SpO2: 99% 99% 96% 96%    Wt Readings from Last 3 Encounters:  07/27/14 95.255 kg (210 lb)  11/05/10 95.709 kg (211 lb)     Intake/Output Summary (Last 24 hours) at 07/28/14 1328 Last data filed at 07/28/14 0500  Gross per 24 hour  Intake  687.5 ml  Output      0 ml  Net  687.5 ml    Exam  General:  Well developed, well nourished, NAD, appears stated age  HEENT: NCAT, mucous membranes moist. Mild tenderness to palpation of the left ear, jaw, cheek, neck  Cardiovascular: S1 S2 auscultated, no rubs, murmurs or gallops. Regular rate and rhythm.  Respiratory: Clear to auscultation bilaterally with equal chest rise  Abdomen: Soft, obese, nontender, nondistended, + bowel sounds  Extremities: warm dry without cyanosis clubbing or edema  Neuro: AAOx3, cranial nerves grossly intact. Strength 5/5 in patient's upper and lower extremities bilaterally  Psych: Normal affect and demeanor with intact judgement and insight  Data Review   Micro Results No results found for this or any previous visit (from the past 240 hour(s)).  Radiology Reports Ct Temporal Bones W/o Cm  07/27/2014   CLINICAL DATA:  39 year old female with left ear pain for past week. On antibiotics. Pain has increased over the past 2 days. Initial encounter.  EXAM: CT TEMPORAL BONES WITHOUT CONTRAST  TECHNIQUE: Axial and coronal plane CT imaging of the petrous temporal bones was performed with thin-collimation image reconstruction. No intravenous contrast was administered. Multiplanar CT image reconstructions were also generated.  COMPARISON:  None.  FINDINGS: Opacification of portions of the left middle ear cavity surrounding the ossicles slightly limiting evaluation of the stapes and adjacent lenticular process but without clear destruction of ossicles. There is however, minimal blunting of the scutum and therefore cholesteatoma cannot be excluded. Opacification left sinus tympani.  Bone cover of the horizontal portion of the left seventh cranial nerve is thin or dehiscent.  Roof of the left middle ear cavity is thin or partially dehiscent.  Partial opacification left mastoid air cells without loss is septations to suggest coalescing mastoiditis.  Soft tissue prominence along the left external auditory canal greater superiorly. This  may reflect changes of external otitis.  Labyrinthine structures are normal and symmetric bilaterally.  Minimal partial opacification right mastoid air cells. Right middle ear is clear.  No obstructing lesion of the eustachian tube is noted.  Incidentally noted are small parotid lymph nodes bilaterally.  Visualized intracranial structures unremarkable. Visualized paranasal sinuses are clear.  IMPRESSION: Opacification of portions of the left middle ear cavity as noted above with minimal blunting of the scutum and therefore cholesteatoma cannot be excluded.  Bone cover of the horizontal portion of the left seventh cranial nerve is thin or dehiscent.  Roof of the left middle ear cavity is thin or partially dehiscent.  Partial opacification left mastoid air cells without loss is septations to suggest coalescing mastoiditis.  Soft tissue prominence along the left external auditory canal greater superiorly. This may  reflect changes of external otitis (is the patient diabetic raising the possibility of malignant otitis externa).  Minimal partial opacification right mastoid air cells. Right middle ear is clear.   Electronically Signed   By: Lacy Duverney M.D.   On: 07/27/2014 16:29    CBC  Recent Labs Lab 07/27/14 1359 07/28/14 0604  WBC 9.3 8.0  HGB 11.9* 10.5*  HCT 38.1 33.7*  PLT 262 223  MCV 83.7 84.0  MCH 26.2 26.2  MCHC 31.2 31.2  RDW 14.5 14.6  LYMPHSABS 2.1  --   MONOABS 1.1*  --   EOSABS 0.2  --   BASOSABS 0.0  --     Chemistries   Recent Labs Lab 07/27/14 1359 07/28/14 0604  NA 137 137  K 4.0 4.0  CL 105 106  CO2 25 25  GLUCOSE 84 89  BUN 11 10  CREATININE 0.74 0.69  CALCIUM 9.0 8.1*  AST 23  --   ALT 20  --   ALKPHOS 98  --   BILITOT 0.3  --    ------------------------------------------------------------------------------------------------------------------ estimated creatinine clearance is 106.7 mL/min (by C-G formula based on Cr of  0.69). ------------------------------------------------------------------------------------------------------------------ No results for input(s): HGBA1C in the last 72 hours. ------------------------------------------------------------------------------------------------------------------ No results for input(s): CHOL, HDL, LDLCALC, TRIG, CHOLHDL, LDLDIRECT in the last 72 hours. ------------------------------------------------------------------------------------------------------------------ No results for input(s): TSH, T4TOTAL, T3FREE, THYROIDAB in the last 72 hours.  Invalid input(s): FREET3 ------------------------------------------------------------------------------------------------------------------ No results for input(s): VITAMINB12, FOLATE, FERRITIN, TIBC, IRON, RETICCTPCT in the last 72 hours.  Coagulation profile No results for input(s): INR, PROTIME in the last 168 hours.  No results for input(s): DDIMER in the last 72 hours.  Cardiac Enzymes No results for input(s): CKMB, TROPONINI, MYOGLOBIN in the last 168 hours.  Invalid input(s): CK ------------------------------------------------------------------------------------------------------------------ Invalid input(s): POCBNP    Montrice Montuori D.O. on 07/28/2014 at 1:28 PM  Between 7am to 7pm - Pager - [phone]  After 7pm go to [email or web address] - password [email or web address]  And look for the night coverage person covering for me after hours  [facility] Office  [phone]

## 2014-07-28 NOTE — Consult Note (Signed)
Reason for Consult: Severe left ear infection Referring Physician: Ezequiel Essex, MD  HPI:  Rebecca Rivers is an 39 y.o. female who presented to the ER yesterday c/o severe left ear pain. She has been experiencing constant left otalgia for the past 3 days. The pain has worsened over the past 2 days. She originally saw an Urgent Care provider 3 days ago who made a diagnosis of otitis media and gave her omnicef and Ciprodex drops, but she had taken both for 3 days with no relief. Patient then went to another Urgent Care clinic ([facility]) this morning and was sent to the ER because the provider was unable to visualize the inside of her ear. Patient states she had some purulent drainage from the ear when the provider cleaned her ear, but has otherwise not noticed any drainage. She also reports associated fever that she noticed on Thursday, muffled hearing that is gradually worsening, lightheadedness with standing, and jaw pain. Patient reports a history of otitis media as a child. Patient states she went to [employer] last week and rode water rides, but denies submerging her head underwater. She denies a history of ear tube placement. She also denies difficulty swallowing, sore throat, nasal congestion, neck pain, neck stiffness, blurred vision, sudden loss of vision, tinnitus, dizziness, syncope, nausea, vomiting, chest pain, SOB, difficulty breathing, or any throat-closing sensation.  Past Medical History  Diagnosis Date  . Preterm labor     Past Surgical History  Procedure Laterality Date  . Cesarean section    . Laparoscopic cholecystectomy  2011  . Arthroscopic repair acl  2003  . Adenoidectomy  1983  . Cesarean section  11/06/2010    Procedure: CESAREAN SECTION;  Surgeon: Marvene Staff, MD;  Location: [facility];  Service: Gynecology;  Laterality: N/A;  Repeat cesarean section with delivery of baby girl at 69. Apgars 7/9.    Family History  Problem Relation Age  of Onset  . Diabetes Father     Social History:  reports that she has never smoked. She does not have any smokeless tobacco history on file. She reports that she does not drink alcohol or use illicit drugs.  Allergies:  Allergies  Allergen Reactions  . Other Rash    BLUE ULTRASOUND GEL  . Neomycin Rash  . Prednisone Swelling and Rash  . Thimerosal Swelling and Rash    Prior to Admission medications   Medication Sig Start Date End Date Taking? Authorizing Provider  cefdinir (OMNICEF) 300 MG capsule Take 300 mg by mouth 2 (two) times daily. 07/24/14 08/03/14 Yes Historical Provider, MD  ciprofloxacin-dexamethasone (CIPRODEX) otic suspension Place 4 drops into the left ear 2 (two) times daily. 07/24/14 08/03/14 Yes Historical Provider, MD  fexofenadine (ALLEGRA) 180 MG tablet Take 180 mg by mouth daily.   Yes Historical Provider, MD  ranitidine (ZANTAC) 75 MG tablet Take 75 mg by mouth 2 (two) times daily as needed for heartburn.    Yes Historical Provider, MD  polysaccharide iron (NIFEREX) 150 MG CAPS capsule Take 1 capsule (150 mg total) by mouth daily. 11/08/10   Artelia Laroche, CNM    Medications:  I have reviewed the patient's current medications. Scheduled: . ampicillin-sulbactam (UNASYN) IV  3 g Intravenous Q6H  . ciprofloxacin-dexamethasone  4 drop Left Ear BID  . famotidine  40 mg Oral Q supper  . heparin  5,000 Units Subcutaneous 3 times per day   KZS:[id number] **OR** acetaminophen, alum & mag hydroxide-simeth, guaiFENesin-dextromethorphan, HYDROcodone-acetaminophen, ibuprofen,  morphine injection, ondansetron **OR** ondansetron (ZOFRAN) IV  Results for orders placed or performed during the hospital encounter of 07/27/14 (from the past 48 hour(s))  CBC with Differential/Platelet     Status: Abnormal   Collection Time: 07/27/14  1:59 PM  Result Value Ref Range   WBC 9.3 4.0 - 10.5 K/uL   RBC 4.55 3.87 - 5.11 MIL/uL   Hemoglobin 11.9 (L) 12.0 - 15.0 g/dL   HCT 38.1 36.0 -  46.0 %   MCV 83.7 78.0 - 100.0 fL   MCH 26.2 26.0 - 34.0 pg   MCHC 31.2 30.0 - 36.0 g/dL   RDW 14.5 11.5 - 15.5 %   Platelets 262 150 - 400 K/uL   Neutrophils Relative % 64 43 - 77 %   Neutro Abs 5.9 1.7 - 7.7 K/uL   Lymphocytes Relative 22 12 - 46 %   Lymphs Abs 2.1 0.7 - 4.0 K/uL   Monocytes Relative 12 3 - 12 %   Monocytes Absolute 1.1 (H) 0.1 - 1.0 K/uL   Eosinophils Relative 2 0 - 5 %   Eosinophils Absolute 0.2 0.0 - 0.7 K/uL   Basophils Relative 0 0 - 1 %   Basophils Absolute 0.0 0.0 - 0.1 K/uL  Comprehensive metabolic panel     Status: Abnormal   Collection Time: 07/27/14  1:59 PM  Result Value Ref Range   Sodium 137 135 - 145 mmol/L   Potassium 4.0 3.5 - 5.1 mmol/L   Chloride 105 96 - 112 mmol/L   CO2 25 19 - 32 mmol/L   Glucose, Bld 84 70 - 99 mg/dL   BUN 11 6 - 23 mg/dL   Creatinine, Ser 0.74 0.50 - 1.10 mg/dL   Calcium 9.0 8.4 - 10.5 mg/dL   Total Protein 7.5 6.0 - 8.3 g/dL   Albumin 3.1 (L) 3.5 - 5.2 g/dL   AST 23 0 - 37 U/L   ALT 20 0 - 35 U/L   Alkaline Phosphatase 98 39 - 117 U/L   Total Bilirubin 0.3 0.3 - 1.2 mg/dL   GFR calc non Af Amer >90 >90 mL/min   GFR calc Af Amer >90 >90 mL/min    Comment: (NOTE) The eGFR has been calculated using the CKD EPI equation. This calculation has not been validated in all clinical situations. eGFR's persistently <90 mL/min signify possible Chronic Kidney Disease.    Anion gap 7 5 - 15  POC urine preg, ED (not at [facility])     Status: None   Collection Time: 07/27/14  4:54 PM  Result Value Ref Range   Preg Test, Ur NEGATIVE NEGATIVE    Comment:        THE SENSITIVITY OF THIS METHODOLOGY IS >24 mIU/mL   Glucose, capillary     Status: Abnormal   Collection Time: 07/28/14  5:30 AM  Result Value Ref Range   Glucose-Capillary 102 (H) 70 - 99 mg/dL   Comment 1 Notify RN    Comment 2 Document in Chart   Basic metabolic panel     Status: Abnormal   Collection Time: 07/28/14  6:04 AM  Result Value Ref Range   Sodium 137  135 - 145 mmol/L   Potassium 4.0 3.5 - 5.1 mmol/L   Chloride 106 96 - 112 mmol/L   CO2 25 19 - 32 mmol/L   Glucose, Bld 89 70 - 99 mg/dL   BUN 10 6 - 23 mg/dL   Creatinine, Ser 0.69 0.50 - 1.10 mg/dL  Calcium 8.1 (L) 8.4 - 10.5 mg/dL   GFR calc non Af Amer >90 >90 mL/min   GFR calc Af Amer >90 >90 mL/min    Comment: (NOTE) The eGFR has been calculated using the CKD EPI equation. This calculation has not been validated in all clinical situations. eGFR's persistently <90 mL/min signify possible Chronic Kidney Disease.    Anion gap 6 5 - 15  CBC     Status: Abnormal   Collection Time: 07/28/14  6:04 AM  Result Value Ref Range   WBC 8.0 4.0 - 10.5 K/uL   RBC 4.01 3.87 - 5.11 MIL/uL   Hemoglobin 10.5 (L) 12.0 - 15.0 g/dL   HCT 33.7 (L) 36.0 - 46.0 %   MCV 84.0 78.0 - 100.0 fL   MCH 26.2 26.0 - 34.0 pg   MCHC 31.2 30.0 - 36.0 g/dL   RDW 14.6 11.5 - 15.5 %   Platelets 223 150 - 400 K/uL    Ct Temporal Bones W/o Cm  07/27/2014   CLINICAL DATA:  39 year old female with left ear pain for past week. On antibiotics. Pain has increased over the past 2 days. Initial encounter.  EXAM: CT TEMPORAL BONES WITHOUT CONTRAST  TECHNIQUE: Axial and coronal plane CT imaging of the petrous temporal bones was performed with thin-collimation image reconstruction. No intravenous contrast was administered. Multiplanar CT image reconstructions were also generated.  COMPARISON:  None.  FINDINGS: Opacification of portions of the left middle ear cavity surrounding the ossicles slightly limiting evaluation of the stapes and adjacent lenticular process but without clear destruction of ossicles. There is however, minimal blunting of the scutum and therefore cholesteatoma cannot be excluded. Opacification left sinus tympani.  Bone cover of the horizontal portion of the left seventh cranial nerve is thin or dehiscent.  Roof of the left middle ear cavity is thin or partially dehiscent.  Partial opacification left mastoid  air cells without loss is septations to suggest coalescing mastoiditis.  Soft tissue prominence along the left external auditory canal greater superiorly. This may reflect changes of external otitis.  Labyrinthine structures are normal and symmetric bilaterally.  Minimal partial opacification right mastoid air cells. Right middle ear is clear.  No obstructing lesion of the eustachian tube is noted.  Incidentally noted are small parotid lymph nodes bilaterally.  Visualized intracranial structures unremarkable. Visualized paranasal sinuses are clear.  IMPRESSION: Opacification of portions of the left middle ear cavity as noted above with minimal blunting of the scutum and therefore cholesteatoma cannot be excluded.  Bone cover of the horizontal portion of the left seventh cranial nerve is thin or dehiscent.  Roof of the left middle ear cavity is thin or partially dehiscent.  Partial opacification left mastoid air cells without loss is septations to suggest coalescing mastoiditis.  Soft tissue prominence along the left external auditory canal greater superiorly. This may reflect changes of external otitis (is the patient diabetic raising the possibility of malignant otitis externa).  Minimal partial opacification right mastoid air cells. Right middle ear is clear.   Electronically Signed   By: Genia Del M.D.   On: 07/27/2014 16:29   Review of Systems  Constitutional: Positive for fever.  HENT: Positive for ear pain. Negative for congestion, sore throat, tinnitus and trouble swallowing.  Eyes: Negative for visual disturbance.  Respiratory: Negative for shortness of breath.  Cardiovascular: Negative for chest pain.  Gastrointestinal: Negative for nausea and vomiting.  Musculoskeletal: Negative for neck pain and neck stiffness.  Neurological: Negative for dizziness  and syncope.   Blood pressure 130/80, pulse 76, temperature 98.2 F (36.8 C), temperature source Oral, resp. rate 17, height _0  (1.626  m), weight 95.255 kg (210 lb), last menstrual period 07/11/2014, SpO2 96 %, not currently breastfeeding.  Physical Exam  Constitutional: She is oriented to person, place, and time. She appears well-developed and well-nourished. No distress.  Head: Normocephalic and atraumatic.  Right Ear: Auricle, tympanic membrane, and ear canal normal.  Left Ear: Significantly edematous left EAC. Mildly tender to touch. TM could not be visualized. Squamous debris noted in the left EAC. No mastoid tenderness.  Mouth/Throat: Oropharynx is clear and moist. No oropharyngeal exudate.  Eyes: Conjunctivae and EOM are normal. Pupils are equal, round, and reactive to light. Right eye exhibits no discharge. Left eye exhibits no discharge.  Neck: Normal range of motion. Neck supple. No tracheal deviation present.  Pulmonary/Chest: Effort normal and breath sounds normal. No respiratory distress. She has no wheezes.  Musculoskeletal: Normal range of motion.  Lymphadenopathy:  She has no cervical adenopathy.  Neurological: She is alert and oriented to person, place, and time. No cranial nerve deficit. She exhibits normal muscle tone. Coordination normal.  Skin: Skin is warm and dry. No rash noted. She is not diaphoretic. No erythema.  Psychiatric: She has a normal mood and affect. Her behavior is normal. Thought content normal.   Assessment/Plan: Acute left otitis externa and otitis media, not responding to oral and topical abx initially. Now clinically improved. Continue unasyn for 1 more day. May d/c home tomorrow with ciprodex and augmentin. Pt will follow up at my office after discharge.  Shanee Batch,SUI W 07/28/2014, 10:18 AM

## 2014-07-29 LAB — CBC
HCT: 33.5 % — ABNORMAL LOW (ref 36.0–46.0)
Hemoglobin: 10.5 g/dL — ABNORMAL LOW (ref 12.0–15.0)
MCH: 26.4 pg (ref 26.0–34.0)
MCHC: 31.3 g/dL (ref 30.0–36.0)
MCV: 84.2 fL (ref 78.0–100.0)
Platelets: 205 K/uL (ref 150–400)
RBC: 3.98 MIL/uL (ref 3.87–5.11)
RDW: 14.4 % (ref 11.5–15.5)
WBC: 5.9 K/uL (ref 4.0–10.5)

## 2014-07-29 MED ORDER — CIPROFLOXACIN-DEXAMETHASONE 0.3-0.1 % OT SUSP: 4.0000 [drp] | Freq: Two times a day (BID) | OTIC | Status: AC

## 2014-07-29 MED ORDER — AMOXICILLIN-POT CLAVULANATE 875-125 MG PO TABS: 1.0000 | ORAL_TABLET | Freq: Two times a day (BID) | ORAL | Status: DC

## 2014-07-29 MED ORDER — IBUPROFEN 600 MG PO TABS: 600.0000 mg | ORAL_TABLET | Freq: Four times a day (QID) | ORAL | Status: AC | PRN

## 2014-07-29 NOTE — Discharge Instructions (Signed)
Otitis Media Otitis media is redness, soreness, and inflammation of the middle ear. Otitis media may be caused by allergies or, most commonly, by infection. Often it occurs as a complication of the common cold. SIGNS AND SYMPTOMS Symptoms of otitis media may include:  Earache.  Fever.  Ringing in your ear.  Headache.  Leakage of fluid from the ear. DIAGNOSIS To diagnose otitis media, your health care provider will examine your ear with an otoscope. This is an instrument that allows your health care provider to see into your ear in order to examine your eardrum. Your health care provider also will ask you questions about your symptoms. TREATMENT  Typically, otitis media resolves on its own within 3-5 days. Your health care provider may prescribe medicine to ease your symptoms of pain. If otitis media does not resolve within 5 days or is recurrent, your health care provider may prescribe antibiotic medicines if he or she suspects that a bacterial infection is the cause. HOME CARE INSTRUCTIONS   If you were prescribed an antibiotic medicine, finish it all even if you start to feel better.  Take medicines only as directed by your health care provider.  Keep all follow-up visits as directed by your health care provider. SEEK MEDICAL CARE IF:  You have otitis media only in one ear, or bleeding from your nose, or both.  You notice a lump on your neck.  You are not getting better in 3-5 days.  You feel worse instead of better. SEEK IMMEDIATE MEDICAL CARE IF:   You have pain that is not controlled with medicine.  You have swelling, redness, or pain around your ear or stiffness in your neck.  You notice that part of your face is paralyzed.  You notice that the bone behind your ear (mastoid) is tender when you touch it. MAKE SURE YOU:   Understand these instructions.  Will watch your condition.  Will get help right away if you are not doing well or get worse. Document Released:  01/08/2004 Document Revised: 08/19/2013 Document Reviewed: 10/30/2012 [facility] Patient Information 2015 [employer] [place]. This information is not intended to replace advice given to you by your health care provider. Make sure you discuss any questions you have with your health care provider. Otitis Externa Otitis externa is a bacterial or fungal infection of the outer ear canal. This is the area from the eardrum to the outside of the ear. Otitis externa is sometimes called "swimmer's ear." CAUSES  Possible causes of infection include:  Swimming in dirty water.  Moisture remaining in the ear after swimming or bathing.  Mild injury (trauma) to the ear.  Objects stuck in the ear (foreign body).  Cuts or scrapes (abrasions) on the outside of the ear. SIGNS AND SYMPTOMS  The first symptom of infection is often itching in the ear canal. Later signs and symptoms may include swelling and redness of the ear canal, ear pain, and yellowish-white fluid (pus) coming from the ear. The ear pain may be worse when pulling on the earlobe. DIAGNOSIS  Your health care provider will perform a physical exam. A sample of fluid may be taken from the ear and examined for bacteria or fungi. TREATMENT  Antibiotic ear drops are often given for 10 to 14 days. Treatment may also include pain medicine or corticosteroids to reduce itching and swelling. HOME CARE INSTRUCTIONS   Apply antibiotic ear drops to the ear canal as prescribed by your health care provider.  Take medicines only as directed by  your health care provider.  If you have diabetes, follow any additional treatment instructions from your health care provider.  Keep all follow-up visits as directed by your health care provider. PREVENTION   Keep your ear dry. Use the corner of a towel to absorb water out of the ear canal after swimming or bathing.  Avoid scratching or putting objects inside your ear. This can damage the ear canal or remove the  protective wax that lines the canal. This makes it easier for bacteria and fungi to grow.  Avoid swimming in lakes, polluted water, or poorly chlorinated pools.  You may use ear drops made of rubbing alcohol and vinegar after swimming. Combine equal parts of white vinegar and alcohol in a bottle. Put 3 or 4 drops into each ear after swimming. SEEK MEDICAL CARE IF:   You have a fever.  Your ear is still red, swollen, painful, or draining pus after 3 days.  Your redness, swelling, or pain gets worse.  You have a severe headache.  You have redness, swelling, pain, or tenderness in the area behind your ear. MAKE SURE YOU:   Understand these instructions.  Will watch your condition.  Will get help right away if you are not doing well or get worse. Document Released: 04/04/2005 Document Revised: 08/19/2013 Document Reviewed: 04/21/2011 [facility] Patient Information 2015 [place], [place]. This information is not intended to replace advice given to you by your health care provider. Make sure you discuss any questions you have with your health care provider.      To whom it concern:  Ms. Jenah Vanasten  was admitted to the Hospital from 07/27/2014 to 07/29/2014 and was discharged on 07/29/2014. Mr./ Ms. Izamar Linden should be excused from work starting4/01/2015 and may return to work without any restrictions on 07/31/2014.  Call [facility] office [phone] for any questions.  Sincerely, Edsel Petrin, DO [facility] 07/29/2014, 9:39 AM

## 2014-07-29 NOTE — Discharge Summary (Signed)
Physician Discharge Summary  Rebecca AgresteHeather L Rivers KXF:[id number] DOB: 11-30-1975 DOA: 07/27/2014  PCP: Astrid DivineGRIFFIN,ELAINE COLLINS, MD  Admit date: 07/27/2014 Discharge date: 07/29/2014  Time spent: 35 minutes  Recommendations for Outpatient Follow-up:  Patient will be discharged to home.  She will need to follow up with Dr. Suszanne Connerseoh on 07/30/2014.  Patient will need to follow up with primary care provider within one week of discharge.  Patient should continue medications as prescribed.  Patient should follow a regular diet.   Discharge Diagnoses:  Principal Problem:   Acute mastoiditis of left side Active Problems:   Iron deficiency anemia   Otitis externa   Acute mastoiditis GERD  Discharge Condition:Stable  Diet recommendation: Regular  Filed Weights   07/27/14 1027 07/27/14 1920 07/29/14 0529  Weight: 96.163 kg (212 lb) 95.255 kg (210 lb) 96.8 kg (213 lb 6.5 oz)    History of present illness:  on 07/27/2014 by Dr. Susa RaringPrashant Singh Phineas SemenHeather Rivers is a 39 y.o. female, who is in reasonable state of health, besides GERD no other health issues, started having left earache since last Thursday 3 days ago, went to urgent care and was placed on oral cephalosporin without much benefit, she started experiencing low-grade fevers and some hearing loss in the left ear, her pain is throbbing, constant, no aggravating factors but relieved with Motrin. Associated with mild hearing loss. She has not noticed any discharge. No preceding illness except some seasonal allergies. No exposure to sick contacts. He came to the ER where workup including head CT scan was suggestive of left-sided acute mastoiditis. Her case was discussed with ENT physician Dr.Teoh who requested hospitalist to admit. She denies any meningismus signs. No headache, no photophobia, no neck stiffness. No other systemic complaints.  Hospital Course:  Acute left otitis externa/media -Failed outpatient antibiotics -Initially placed on IV  unaysn -ENT, Dr. Suszanne Connerseoh consulted and appreicated, recommended IV antibiotics for an additional day, with discharge on Augmentin and Ciprodex drops, with outpatient followup -Patient will be discharged on Augmentin and Ciprodex drops.  She will follow up with Dr. Suszanne Connerseoh on 07/30/2014  GERD -Continue pepcid  Iron deficiency anemia -Hb currently stable, small drop likely secondary to IVF   Procedures  None  Consults  ENT, Dr. Suszanne Connerseoh  Discharge Exam: Filed Vitals:   07/29/14 0529  BP: 121/70  Pulse: 68  Temp: 97.9 F (36.6 C)  Resp: 17   Exam  General: Well developed, well nourished, NAD  HEENT: NCAT, mucous membranes moist. Mild fullness/tenderness to palpation of the left ear, jaw, cheek, neck  Cardiovascular: S1 S2 auscultated, RRR, No murmurs  Respiratory: Clear to auscultation   Abdomen: Soft, obese, nontender, nondistended, + bowel sounds  Extremities: warm dry without cyanosis clubbing or edema  Neuro: AAOx3, nonfocal  Psych: Appropriate mood and affect, pleasant.   Discharge Instructions      Discharge Instructions    Discharge instructions    Complete by:  As directed   Patient will be discharged to home.  She will need to follow up with Dr. Suszanne Connerseoh on 07/30/2014.  Patient will need to follow up with primary care provider within one week of discharge.  Patient should continue medications as prescribed.  Patient should follow a regular diet.            Medication List    STOP taking these medications        cefdinir 300 MG capsule  Commonly known as:  OMNICEF      TAKE these medications  amoxicillin-clavulanate 875-125 MG per tablet  Commonly known as:  AUGMENTIN  Take 1 tablet by mouth 2 (two) times daily.     ciprofloxacin-dexamethasone otic suspension  Commonly known as:  CIPRODEX  Place 4 drops into the left ear 2 (two) times daily.     fexofenadine 180 MG tablet  Commonly known as:  ALLEGRA  Take 180 mg by mouth daily.      ibuprofen 600 MG tablet  Commonly known as:  ADVIL,MOTRIN  Take 1 tablet (600 mg total) by mouth every 6 (six) hours as needed for headache.     polysaccharide iron 150 MG capsule  Generic drug:  iron polysaccharides  Take 1 capsule (150 mg total) by mouth daily.     ranitidine 75 MG tablet  Commonly known as:  ZANTAC  Take 75 mg by mouth 2 (two) times daily as needed for heartburn.       Allergies  Allergen Reactions  . Other Rash    BLUE ULTRASOUND GEL  . Neomycin Rash  . Prednisone Swelling and Rash  . Thimerosal Swelling and Rash   Follow-up Information    Follow up with Astrid Divine, MD. Schedule an appointment as soon as possible for a visit in 1 week.   Specialty:  Family Medicine   Why:  Hospital followup   Contact information:   [address] [place] [address] [phone]       Follow up with Darletta Moll, MD On 07/30/2014.   Specialty:  Otolaryngology   Contact information:   [address] [place] [address] [phone]        The results of significant diagnostics from this hospitalization (including imaging, microbiology, ancillary and laboratory) are listed below for reference.    Significant Diagnostic Studies: Ct Temporal Bones W/o Cm  07/27/2014   CLINICAL DATA:  39 year old female with left ear pain for past week. On antibiotics. Pain has increased over the past 2 days. Initial encounter.  EXAM: CT TEMPORAL BONES WITHOUT CONTRAST  TECHNIQUE: Axial and coronal plane CT imaging of the petrous temporal bones was performed with thin-collimation image reconstruction. No intravenous contrast was administered. Multiplanar CT image reconstructions were also generated.  COMPARISON:  None.  FINDINGS: Opacification of portions of the left middle ear cavity surrounding the ossicles slightly limiting evaluation of the stapes and adjacent lenticular process but without clear destruction of ossicles. There is however, minimal  blunting of the scutum and therefore cholesteatoma cannot be excluded. Opacification left sinus tympani.  Bone cover of the horizontal portion of the left seventh cranial nerve is thin or dehiscent.  Roof of the left middle ear cavity is thin or partially dehiscent.  Partial opacification left mastoid air cells without loss is septations to suggest coalescing mastoiditis.  Soft tissue prominence along the left external auditory canal greater superiorly. This may reflect changes of external otitis.  Labyrinthine structures are normal and symmetric bilaterally.  Minimal partial opacification right mastoid air cells. Right middle ear is clear.  No obstructing lesion of the eustachian tube is noted.  Incidentally noted are small parotid lymph nodes bilaterally.  Visualized intracranial structures unremarkable. Visualized paranasal sinuses are clear.  IMPRESSION: Opacification of portions of the left middle ear cavity as noted above with minimal blunting of the scutum and therefore cholesteatoma cannot be excluded.  Bone cover of the horizontal portion of the left seventh cranial nerve is thin or dehiscent.  Roof of the left middle ear cavity is thin  or partially dehiscent.  Partial opacification left mastoid air cells without loss is septations to suggest coalescing mastoiditis.  Soft tissue prominence along the left external auditory canal greater superiorly. This may reflect changes of external otitis (is the patient diabetic raising the possibility of malignant otitis externa).  Minimal partial opacification right mastoid air cells. Right middle ear is clear.   Electronically Signed   By: Lacy Duverney M.D.   On: 07/27/2014 16:29    Microbiology: Recent Results (from the past 240 hour(s))  Culture, blood (routine x 2)     Status: None (Preliminary result)   Collection Time: 07/27/14  6:43 PM  Result Value Ref Range Status   Specimen Description BLOOD RIGHT ARM  Final   Special Requests BOTTLES DRAWN AEROBIC  AND ANAEROBIC 10 CC  Final   Culture   Final           BLOOD CULTURE RECEIVED NO GROWTH TO DATE CULTURE WILL BE HELD FOR 5 DAYS BEFORE ISSUING A FINAL NEGATIVE REPORT Performed at [facility]    Report Status PENDING  Incomplete  Culture, blood (routine x 2)     Status: None (Preliminary result)   Collection Time: 07/27/14  6:51 PM  Result Value Ref Range Status   Specimen Description BLOOD RIGHT HAND  Final   Special Requests BOTTLES DRAWN AEROBIC ONLY 10 CC  Final   Culture   Final           BLOOD CULTURE RECEIVED NO GROWTH TO DATE CULTURE WILL BE HELD FOR 5 DAYS BEFORE ISSUING A FINAL NEGATIVE REPORT Performed at [facility]    Report Status PENDING  Incomplete     Labs: Basic Metabolic Panel:  Recent Labs Lab 07/27/14 1359 07/28/14 0604  NA 137 137  K 4.0 4.0  CL 105 106  CO2 25 25  GLUCOSE 84 89  BUN 11 10  CREATININE 0.74 0.69  CALCIUM 9.0 8.1*   Liver Function Tests:  Recent Labs Lab 07/27/14 1359  AST 23  ALT 20  ALKPHOS 98  BILITOT 0.3  PROT 7.5  ALBUMIN 3.1*   No results for input(s): LIPASE, AMYLASE in the last 168 hours. No results for input(s): AMMONIA in the last 168 hours. CBC:  Recent Labs Lab 07/27/14 1359 07/28/14 0604 07/29/14 0436  WBC 9.3 8.0 5.9  NEUTROABS 5.9  --   --   HGB 11.9* 10.5* 10.5*  HCT 38.1 33.7* 33.5*  MCV 83.7 84.0 84.2  PLT 262 223 205   Cardiac Enzymes: No results for input(s): CKTOTAL, CKMB, CKMBINDEX, TROPONINI in the last 168 hours. BNP: BNP (last 3 results) No results for input(s): BNP in the last 8760 hours.  ProBNP (last 3 results) No results for input(s): PROBNP in the last 8760 hours.  CBG:  Recent Labs Lab 07/28/14 0530  GLUCAP 102*       Signed:  Edsel Petrin  [facility] 07/29/2014, 9:40 AM

## 2014-07-29 NOTE — Progress Notes (Signed)
Discharge instructions reviewed with pt and instructed on where to pick up prescriptions. Pt verbalized understanding and had no questions.  Pt discharged in stable condition via wheelchair with family.  Aubri Gathright Lindsay 

## 2014-08-03 LAB — CULTURE, BLOOD (ROUTINE X 2)
CULTURE: NO GROWTH
CULTURE: NO GROWTH

## 2016-09-14 DIAGNOSIS — R109 Unspecified abdominal pain: Secondary | ICD-10-CM | POA: Diagnosis not present

## 2016-09-26 DIAGNOSIS — D649 Anemia, unspecified: Secondary | ICD-10-CM | POA: Diagnosis not present

## 2016-09-30 ENCOUNTER — Other Ambulatory Visit: Payer: Self-pay | Admitting: Physician Assistant

## 2016-09-30 DIAGNOSIS — D649 Anemia, unspecified: Secondary | ICD-10-CM

## 2016-09-30 DIAGNOSIS — R109 Unspecified abdominal pain: Secondary | ICD-10-CM

## 2016-10-03 ENCOUNTER — Ambulatory Visit
Admission: RE | Admit: 2016-10-03 | Discharge: 2016-10-03 | Disposition: A | Discharge status: Home / Self Care | Payer: Commercial Managed Care - PPO | Source: Ambulatory Visit | Attending: Physician Assistant | Admitting: Physician Assistant

## 2016-10-03 DIAGNOSIS — D649 Anemia, unspecified: Secondary | ICD-10-CM

## 2016-10-03 DIAGNOSIS — R109 Unspecified abdominal pain: Secondary | ICD-10-CM

## 2016-10-03 MED ORDER — IOPAMIDOL (ISOVUE-300) INJECTION 61%
100.0000 mL | Freq: Once | INTRAVENOUS | Status: AC | PRN
  Administered 2016-10-03: 100 mL via INTRAVENOUS

## 2016-11-24 DIAGNOSIS — D649 Anemia, unspecified: Secondary | ICD-10-CM | POA: Diagnosis not present

## 2016-12-09 DIAGNOSIS — D649 Anemia, unspecified: Secondary | ICD-10-CM | POA: Diagnosis not present

## 2017-02-17 DIAGNOSIS — D649 Anemia, unspecified: Secondary | ICD-10-CM | POA: Diagnosis not present

## 2017-03-03 ENCOUNTER — Encounter: Payer: Self-pay | Admitting: Hematology

## 2017-03-03 ENCOUNTER — Telehealth: Payer: Self-pay | Admitting: Hematology

## 2017-03-03 NOTE — Progress Notes (Signed)
[facility]  [phone] [phone]) [phone]  Clinic New consult Note   Patient Care Team: Maurice SmallGriffin, Elaine, MD as PCP - General (Family Medicine) 03/07/2017  REFERRAL PHYSICIAN: Maurice SmallGriffin, Elaine, MD  CHIEF COMPLAINTS/PURPOSE OF CONSULTATION:  Iron deficient anemia    HISTORY OF PRESENTING ILLNESS:  Rebecca AgresteHeather L Chisolm 41 y.o. female is here because of anemia found on an abnormal CBC from 02/17/2017. She was noted to have hemoglobin levels decreasing from 9.3 (09/14/2016), 9.2 (11/24/2016), to 5.8 (02/17/2017). She also had decreasing MCV levels from 85.2 (09/14/2016), 88.7 (11/24/2016), to 69.1 (02/17/2017). Her ferritin levels were abnormal on both 12/09/2016 at 4.4 and 02/17/2017 at 6.9. She had a FOBT collected on 09/26/2016 that returned negative for ICT #1, ICT #2, and ICT #3. She also had a CMP completed on 09/14/2016 with no abnormalities. She was referred by Milus HeightNoelle Redmon, PA-C at [facility].   She had a CT AP completed on 10/03/2016 due to RUQ pain with previous cholecystectomy with results revealing: IMPRESSION: No acute intra-abdominal or pelvic finding. Punctate nonobstructing left intrarenal calculi. Remote cholecystectomy. 4.1 cm left ovarian cyst.   She was first diagnosed with anemia in May 2018. She was started on 150 mg BID Niferex following this with minimal relief of her hemoglobin levels. She used to have menorrhagia, but that has subsided following use of OCP for the past year. She has lighter menstrual cycles at this time that occur every 35 days and last for 3-4 days. She had an endometrial biopsy in 2016 that was benign. She reports prior history of RUQ abdominal pain that was worked up in June 2018, she reports that this pain has been resolved for 3-4 months at this time and she had a full workup. 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 She works some nights at her places of employment. She has two children 718  and 41 years old as well as being married. Denies smoking cigarettes or consuming ETOH. She is up to date with her mammograms and pap smears. She denies having a GI Specialist.   She denies recent chest pain on exertion, or pre-syncopal episodes. She had not noticed any recent bleeding such as epistaxis, hematuria or hematochezia She denies ever having a colonoscopy performed.  She had no prior history or diagnosis of cancer. Her age appropriate screening programs are up-to-date. She denies any pica and eats a variety of diet. She tends to crave more red meat as of lately.  She never donated blood or received blood transfusion The patient was prescribed oral iron supplements and she takes 150 mg BID Niferex   She reports fatigue, exertional SOB, and palpitations. She reports intermittent diarrhea when she consumes fatty foods and contributes this to her prior surgical hx of cholecystectomy. She reports resolved right sided abdominal pain. She denies CP, hematuria, gum bleeding, nose bleeding, or blood in stool. Denies blood tranfusion of family hx of similar issues. She reports that she craves red meat more than white meat. She denies pica symptoms.     MEDICAL HISTORY:  Past Medical History:  Diagnosis Date  . GERD (gastroesophageal reflux disease)   . Preterm labor     SURGICAL HISTORY: Past Surgical History:  Procedure Laterality Date  . ADENOIDECTOMY  1983  . ARTHROSCOPIC REPAIR ACL  2003  . CESAREAN SECTION    . CESAREAN SECTION N/A 11/06/2010   [facility] by Serita Kyleousins, [facility]  . LAPAROSCOPIC  CHOLECYSTECTOMY  2011    SOCIAL HISTORY: Social History   Socioeconomic History  . Marital status: Married    Spouse name: Not on file  . Number of children: Not on file  . Years of education: Not on file  . Highest education level: Not on file  Social Needs  . Financial resource strain: Not on file  . Food insecurity - worry: Not on file  . Food insecurity -  inability: Not on file  . Transportation needs - medical: Not on file  . Transportation needs - non-medical: Not on file  Occupational History  . Not on file  Tobacco Use  . Smoking status: Never Smoker  . Smokeless tobacco: Never Used  Substance and Sexual Activity  . Alcohol use: No  . Drug use: No  . Sexual activity: Not Currently    Birth control/protection: Condom  Other Topics Concern  . Not on file  Social History Narrative  . Not on file    FAMILY HISTORY: Family History  Problem Relation Age of Onset  . Diabetes Father     ALLERGIES:  is allergic to other; neomycin; prednisone; and thimerosal.  MEDICATIONS:  Current Outpatient Medications  Medication Sig Dispense Refill  . Shironda 0.35 MG tablet BCP    . omeprazole (PRILOSEC) 20 MG capsule Take 20 mg daily by mouth.    . polysaccharide iron (NIFEREX) 150 MG CAPS capsule Take 1 capsule (150 mg total) by mouth daily. (Patient taking differently: Take 150 mg 2 (two) times daily by mouth. ) 45 each 0  . fexofenadine (ALLEGRA) 180 MG tablet Take 180 mg by mouth daily.    Marland Kitchen ibuprofen (ADVIL,MOTRIN) 600 MG tablet Take 1 tablet (600 mg total) by mouth every 6 (six) hours as needed for headache. (Patient not taking: Reported on 03/07/2017) 30 tablet 0   No current facility-administered medications for this visit.     REVIEW OF SYSTEMS:   Constitutional: Denies fevers, chills or abnormal night sweats Eyes: Denies blurriness of vision, double vision or watery eyes Ears, nose, mouth, throat, and face: Denies mucositis or sore throat Respiratory: Denies cough, dyspnea or wheezes. (+) SOB Cardiovascular: (+) palpitation. Denies chest discomfort or lower extremity swelling Gastrointestinal:  Denies nausea, heartburn. (+) intermittent diarrhea (+) resolved right sided abdominal pain.  Skin: Denies abnormal skin rashes  Lymphatics: Denies new lymphadenopathy or easy bruising Neurological:Denies numbness, tingling or new  weaknesses Behavioral/Psych: Mood is stable, no new changes  All other systems were reviewed with the patient and are negative.  PHYSICAL EXAMINATION:   Vitals:   03/07/17 0841  BP: (!) 145/88  Pulse: 96  Resp: 18  Temp: 98.4 F (36.9 C)  SpO2: 100%   Filed Weights   03/07/17 0841  Weight: 216 lb 14.4 oz (98.4 kg)    GENERAL:alert, no distress and comfortable SKIN: skin color, texture, turgor are normal, no rashes or significant lesions EYES: normal, conjunctiva are pink and non-injected, sclera clear OROPHARYNX:no exudate, no erythema and lips, buccal mucosa, and tongue normal  NECK: supple, thyroid normal size, non-tender, without nodularity LYMPH:  no palpable lymphadenopathy in the cervical, axillary or inguinal LUNGS: clear to auscultation and percussion with normal breathing effort HEART: regular rate & rhythm and no murmurs and no lower extremity edema ABDOMEN:abdomen soft, non-tender and normal bowel sounds Musculoskeletal:no cyanosis of digits and no clubbing  PSYCH: alert & oriented x 3 with fluent speech NEURO: no focal motor/sensory deficits  LABORATORY DATA:  I have reviewed the data as  listed CBC Latest Ref Rng & Units 07/29/2014 07/28/2014 07/27/2014  WBC 4.0 - 10.5 K/uL 5.9 8.0 9.3  Hemoglobin 12.0 - 15.0 g/dL 10.5(L) 10.5(L) 11.9(L)  Hematocrit 36.0 - 46.0 % 33.5(L) 33.7(L) 38.1  Platelets 150 - 400 K/uL 205 223 262    CMP Latest Ref Rng & Units 07/28/2014 07/27/2014 01/29/2007  Glucose 70 - 99 mg/dL 89 84 098(J128(H)  BUN 6 - 23 mg/dL 10 11 9   Creatinine 0.50 - 1.10 mg/dL 1.910.69 4.780.74 2.950.69  Sodium 135 - 145 mmol/L 137 137 137  Potassium 3.5 - 5.1 mmol/L 4.0 4.0 3.7  Chloride 96 - 112 mmol/L 106 105 104  CO2 19 - 32 mmol/L 25 25 24   Calcium 8.4 - 10.5 mg/dL 8.1(L) 9.0 8.9  Total Protein 6.0 - 8.3 g/dL - 7.5 7.1  Total Bilirubin 0.3 - 1.2 mg/dL - 0.3 0.5  Alkaline Phos 39 - 117 U/L - 98 70  AST 0 - 37 U/L - 23 25  ALT 0 - 35 U/L - 20 17     RADIOGRAPHIC  STUDIES: I have personally reviewed the radiological images as listed and agreed with the findings in the report. No results found.  ASSESSMENT & PLAN:  No problem-specific Assessment & Plan notes found for this encounter.  41 y.o. woman with Iron deficiency anemia  1. Iron deficient Anemia  -Patient has long standing history of anemia, previously was pregnancy and menorrhagia. Ferritin was low, low MCV, this is consistent with iron deficient anemia.  -Patient with prior hx of menorrhagia, resolved with OCP (light menstural cycle once every 35 days lasting 3-4 days), however her anemia does not improve much since then.  -Also her stool OB was negative, I recommend endoscopy to ensure that there is no GI source of bleeding. Will have the patient follow up with [facility] for this procedure.  -Patient has been taking oral iron for more than 6 months, iron deficiency and anemia has not resolved, possible poor absorption.  I recommend IV iron. -IV feraheme discussed and recommended with patient due to patient low response to PO Niferex.  Benefit and the potential side effects, especially allergy reaction including anaphylactic reactions, were discussed with patient, she agrees to proceed. -Patient recent lab work on 02/17/2017 with hemoglobin at 5.8, MCV at 69.1, and ferritin at 6.9.   -Recommended and discussed IV Feraheme twice, once every week and to repeat iron level in 1 month.  -recommended continued use of PO Niferex BID and advised to take with vitamin C or orange juice.  -Will have the patient to follow up in 3 months for close follow up of the patient symptoms.    Plan:  Iv feraheme weekly X2 in next 2-4 weeks  Lab monthly X3 F/u in 3 months  [facility]   All questions were answered. The patient knows to call the clinic with any problems, questions or concerns. I spent 30 minutes counseling the patient face to face. The total time spent in the appointment was 35  minutes and more than 50% was on counseling.     Malachy MoodFeng, Lanora Reveron, MD 03/07/17 9:35 AM   This document serves as a record of services personally performed by Malachy MoodYan Sundeep Cary, MD. It was created on her behalf by Chestine SporeSoijett Blue, a trained medical scribe. The creation of this record is based on the scribe's personal observations and the provider's statements to them.   I have reviewed the above documentation for accuracy and  completeness, and I agree with the above.

## 2017-03-03 NOTE — Telephone Encounter (Signed)
Appt has been scheduled for the Rebecca Rivers to see Dr. Mosetta PuttFeng on 11/20 at 830am. Rebecca Rivers aware to arrive 30 minutes early. Letter mailed.

## 2017-03-07 ENCOUNTER — Encounter: Payer: Self-pay | Admitting: Hematology

## 2017-03-07 ENCOUNTER — Ambulatory Visit: Payer: Commercial Managed Care - PPO | Admitting: Hematology

## 2017-03-07 ENCOUNTER — Telehealth: Payer: Self-pay | Admitting: Hematology

## 2017-03-07 VITALS — BP 145/88 | HR 96 | Temp 98.4°F | Resp 18 | Ht 64.0 in | Wt 216.9 lb

## 2017-03-07 DIAGNOSIS — N920 Excessive and frequent menstruation with regular cycle: Secondary | ICD-10-CM

## 2017-03-07 DIAGNOSIS — D500 Iron deficiency anemia secondary to blood loss (chronic): Secondary | ICD-10-CM

## 2017-03-07 NOTE — Telephone Encounter (Signed)
Gave avs and calendar for December - February 2019 °

## 2017-03-16 ENCOUNTER — Telehealth: Payer: Self-pay | Admitting: Hematology

## 2017-03-16 NOTE — Telephone Encounter (Signed)
Spoke to patient regarding upcoming November appointments per 11/20 sch message.

## 2017-03-17 DIAGNOSIS — D649 Anemia, unspecified: Secondary | ICD-10-CM | POA: Diagnosis not present

## 2017-03-22 ENCOUNTER — Ambulatory Visit (HOSPITAL_BASED_OUTPATIENT_CLINIC_OR_DEPARTMENT_OTHER): Payer: Commercial Managed Care - PPO

## 2017-03-22 VITALS — BP 147/90 | HR 83 | Temp 98.2°F | Resp 18

## 2017-03-22 DIAGNOSIS — D500 Iron deficiency anemia secondary to blood loss (chronic): Secondary | ICD-10-CM | POA: Diagnosis not present

## 2017-03-22 MED ORDER — SODIUM CHLORIDE 0.9 % IV SOLN
Freq: Once | INTRAVENOUS | Status: AC
  Administered 2017-03-22: 09:00:00 via INTRAVENOUS

## 2017-03-22 MED ORDER — SODIUM CHLORIDE 0.9 % IV SOLN
510.0000 mg | Freq: Once | INTRAVENOUS | Status: AC
  Administered 2017-03-22: 510 mg via INTRAVENOUS
  Filled 2017-03-22: qty 17

## 2017-03-22 NOTE — Patient Instructions (Signed)

## 2017-03-22 NOTE — Progress Notes (Signed)
Pt tolerated infusion well. Pt monitored 30 minutes post infusion. Pt and VS stable at discharge.  

## 2017-03-29 ENCOUNTER — Ambulatory Visit (HOSPITAL_BASED_OUTPATIENT_CLINIC_OR_DEPARTMENT_OTHER): Payer: Commercial Managed Care - PPO

## 2017-03-29 VITALS — BP 157/99 | HR 92 | Temp 98.2°F | Resp 17

## 2017-03-29 DIAGNOSIS — D500 Iron deficiency anemia secondary to blood loss (chronic): Secondary | ICD-10-CM

## 2017-03-29 DIAGNOSIS — N920 Excessive and frequent menstruation with regular cycle: Secondary | ICD-10-CM

## 2017-03-29 MED ORDER — SODIUM CHLORIDE 0.9 % IV SOLN
510.0000 mg | Freq: Once | INTRAVENOUS | Status: AC
  Administered 2017-03-29: 510 mg via INTRAVENOUS
  Filled 2017-03-29: qty 17

## 2017-03-29 MED ORDER — HEPARIN SOD (PORK) LOCK FLUSH 100 UNIT/ML IV SOLN
500.0000 [IU] | Freq: Once | INTRAVENOUS | Status: DC | PRN
  Filled 2017-03-29: qty 5

## 2017-03-29 MED ORDER — SODIUM CHLORIDE 0.9 % IV SOLN
Freq: Once | INTRAVENOUS | Status: AC
  Administered 2017-03-29: 09:00:00 via INTRAVENOUS

## 2017-03-29 NOTE — Patient Instructions (Signed)

## 2017-04-05 DIAGNOSIS — D649 Anemia, unspecified: Secondary | ICD-10-CM | POA: Diagnosis not present

## 2017-04-06 ENCOUNTER — Other Ambulatory Visit: Payer: Commercial Managed Care - PPO

## 2017-04-17 ENCOUNTER — Telehealth: Payer: Self-pay | Admitting: *Deleted

## 2017-04-17 NOTE — Telephone Encounter (Signed)
Pt called asking if Dr Mosetta PuttFeng had received lab results that were fax[facility].  Message to Dr Mosetta PuttFeng.

## 2017-04-19 NOTE — Telephone Encounter (Signed)
Called [facility] for lab results.

## 2017-04-20 ENCOUNTER — Telehealth: Payer: Self-pay | Admitting: *Deleted

## 2017-04-20 NOTE — Telephone Encounter (Signed)
[facility].   Left results on Dr. Latanya MaudlinFeng's desk for review.

## 2017-04-28 DIAGNOSIS — D509 Iron deficiency anemia, unspecified: Secondary | ICD-10-CM | POA: Diagnosis not present

## 2017-05-08 ENCOUNTER — Inpatient Hospital Stay: Payer: Commercial Managed Care - PPO | Attending: Hematology

## 2017-05-08 DIAGNOSIS — N920 Excessive and frequent menstruation with regular cycle: Secondary | ICD-10-CM | POA: Insufficient documentation

## 2017-05-08 DIAGNOSIS — D500 Iron deficiency anemia secondary to blood loss (chronic): Secondary | ICD-10-CM | POA: Insufficient documentation

## 2017-05-08 LAB — IRON AND TIBC
Iron: 56 ug/dL (ref 41–142)
Saturation Ratios: 19 % — ABNORMAL LOW (ref 21–57)
TIBC: 293 ug/dL (ref 236–444)
UIBC: 237 ug/dL

## 2017-05-08 LAB — RETICULOCYTES
RBC.: 4.85 MIL/uL (ref 3.70–5.45)
Retic Count, Absolute: 34 10*3/uL (ref 33.7–90.7)
Retic Ct Pct: 0.7 % (ref 0.7–2.1)

## 2017-05-08 LAB — CBC WITH DIFFERENTIAL (CANCER CENTER ONLY)
BASOS PCT: 0 %
Basophils Absolute: 0 10*3/uL (ref 0.0–0.1)
Eosinophils Absolute: 0.1 10*3/uL (ref 0.0–0.5)
Eosinophils Relative: 2 %
HEMATOCRIT: 41.5 % (ref 34.8–46.6)
HEMOGLOBIN: 13.3 g/dL (ref 11.6–15.9)
LYMPHS ABS: 1.7 10*3/uL (ref 0.9–3.3)
LYMPHS PCT: 33 %
MCH: 27.4 pg (ref 25.1–34.0)
MCHC: 32 g/dL (ref 31.5–36.0)
MCV: 85.6 fL (ref 79.5–101.0)
Monocytes Absolute: 0.3 10*3/uL (ref 0.1–0.9)
Monocytes Relative: 6 %
NEUTROS ABS: 3.2 10*3/uL (ref 1.5–6.5)
NEUTROS PCT: 59 %
Platelet Count: 205 10*3/uL (ref 145–400)
RBC: 4.85 MIL/uL (ref 3.70–5.45)
RDW: 22.1 % — ABNORMAL HIGH (ref 11.2–16.1)
WBC Count: 5.3 10*3/uL (ref 3.9–10.3)

## 2017-05-08 LAB — FERRITIN: Ferritin: 81 ng/mL (ref 9–269)

## 2017-05-10 ENCOUNTER — Telehealth: Payer: Self-pay | Admitting: *Deleted

## 2017-05-10 NOTE — Telephone Encounter (Signed)
-----   Message from Malachy MoodYan Feng, MD sent at 05/09/2017 10:22 PM EST ----- Please let pt know that her anemia has resolved, iron level is good, thanks.  Malachy MoodYan Feng  05/09/2017

## 2017-05-10 NOTE — Telephone Encounter (Signed)
Called pt & left vm on identified mobile # that iron level good & anemia resolved per Dr Latanya MaudlinFeng's request.

## 2017-05-30 DIAGNOSIS — D509 Iron deficiency anemia, unspecified: Secondary | ICD-10-CM | POA: Diagnosis not present

## 2017-06-02 DIAGNOSIS — D509 Iron deficiency anemia, unspecified: Secondary | ICD-10-CM | POA: Diagnosis not present

## 2017-06-06 NOTE — Progress Notes (Signed)
[facility]  Telephone:(336) [phone] Fax:(336) [phone]  Clinic Follow Up Note   Patient Care Team: Maurice Small, MD as PCP - General (Family Medicine)   Date of Service:  06/08/2017  REFERRAL PHYSICIAN: Maurice Small, MD  CHIEF COMPLAINTS/PURPOSE OF CONSULTATION:  Iron deficient anemia    HISTORY OF PRESENTING ILLNESS:  Rebecca Rivers 42 y.o. female is here because of anemia found on an abnormal CBC from 02/17/2017. She was noted to have hemoglobin levels decreasing from 9.3 (09/14/2016), 9.2 (11/24/2016), to 5.8 (02/17/2017). She also had decreasing MCV levels from 85.2 (09/14/2016), 88.7 (11/24/2016), to 69.1 (02/17/2017). Her ferritin levels were abnormal on both 12/09/2016 at 4.4 and 02/17/2017 at 6.9. She had a FOBT collected on 09/26/2016 that returned negative for ICT #1, ICT #2, and ICT #3. She also had a CMP completed on 09/14/2016 with no abnormalities. She was referred by Milus Height, PA-C at [facility].   She had a CT AP completed on 10/03/2016 due to RUQ pain with previous cholecystectomy with results revealing: IMPRESSION: No acute intra-abdominal or pelvic finding. Punctate nonobstructing left intrarenal calculi. Remote cholecystectomy. 4.1 cm left ovarian cyst.   She was first diagnosed with anemia in May 2018. She was started on 150 mg BID Niferex following this with minimal relief of her hemoglobin levels. She used to have menorrhagia, but that has subsided following use of OCP for the past year. She has lighter menstrual cycles at this time that occur every 35 days and last for 3-4 days. She had an endometrial biopsy in 2016 that was benign. She reports prior history of RUQ abdominal pain that was worked up in June 2018, she reports that this pain has been resolved for 3-4 months at this time and she had a full workup. 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 She works some nights at her places of employment. She  has two children 4 and 59 years old as well as being married. Denies smoking cigarettes or consuming ETOH. She is up to date with her mammograms and pap smears. She denies having a GI Specialist.   She denies recent chest pain on exertion, or pre-syncopal episodes. She had not noticed any recent bleeding such as epistaxis, hematuria or hematochezia She denies ever having a colonoscopy performed.  She had no prior history or diagnosis of cancer. Her age appropriate screening programs are up-to-date. She denies any pica and eats a variety of diet. She tends to crave more red meat as of lately.  She never donated blood or received blood transfusion The patient was prescribed oral iron supplements and she takes 150 mg BID Niferex   She reports fatigue, exertional SOB, and palpitations. She reports intermittent diarrhea when she consumes fatty foods and contributes this to her prior surgical hx of cholecystectomy. She reports resolved right sided abdominal pain. She denies CP, hematuria, gum bleeding, nose bleeding, or blood in stool. Denies blood tranfusion of family hx of similar issues. She reports that she craves red meat more than white meat. She denies pica symptoms.  CURRENT THERAPY: IV feraheme as needed, oral iron   INTERVAL HISTORY  Wanette L Mikita is here for a follow up of her iron deficient anemia. She presents to the clinic today noting she felt better after her IV iron in 03/2017. She notes her breathing has improved since then.  She takes 150mg  Niferex BID.    On review of symptoms, pt notes her periods  are heavier in the past 2 months since her IV iron. She will see OBGYN next months for her annual checkup.     MEDICAL HISTORY:  Past Medical History:  Diagnosis Date  . GERD (gastroesophageal reflux disease)   . Preterm labor     SURGICAL HISTORY: Past Surgical History:  Procedure Laterality Date  . ADENOIDECTOMY  1983  . ARTHROSCOPIC REPAIR ACL  2003  . CESAREAN  SECTION    . CESAREAN SECTION  11/06/2010   Procedure: CESAREAN SECTION;  Surgeon: Serita Kyle, MD;  Location: [facility];  Service: Gynecology;  Laterality: N/A;  Repeat cesarean section with delivery of baby girl at 31. Apgars 7/9.  Marland Kitchen LAPAROSCOPIC CHOLECYSTECTOMY  2011    SOCIAL HISTORY: Social History   Socioeconomic History  . Marital status: Married    Spouse name: Not on file  . Number of children: Not on file  . Years of education: Not on file  . Highest education level: Not on file  Social Needs  . Financial resource strain: Not on file  . Food insecurity - worry: Not on file  . Food insecurity - inability: Not on file  . Transportation needs - medical: Not on file  . Transportation needs - non-medical: Not on file  Occupational History  . Not on file  Tobacco Use  . Smoking status: Never Smoker  . Smokeless tobacco: Never Used  Substance and Sexual Activity  . Alcohol use: No  . Drug use: No  . Sexual activity: Not Currently    Birth control/protection: Condom  Other Topics Concern  . Not on file  Social History Narrative  . Not on file    FAMILY HISTORY: Family History  Problem Relation Age of Onset  . Diabetes Father     ALLERGIES:  is allergic to other; neomycin; prednisone; and thimerosal.  MEDICATIONS:  Current Outpatient Medications  Medication Sig Dispense Refill  . fexofenadine (ALLEGRA) 180 MG tablet Take 180 mg by mouth daily.    Marland Kitchen [place] 0.35 MG tablet BCP    . ibuprofen (ADVIL,MOTRIN) 600 MG tablet Take 1 tablet (600 mg total) by mouth every 6 (six) hours as needed for headache. 30 tablet 0  . omeprazole (PRILOSEC) 20 MG capsule Take 20 mg daily by mouth.    . polysaccharide iron (NIFEREX) 150 MG CAPS capsule Take 1 capsule (150 mg total) by mouth daily. (Patient taking differently: Take 150 mg 2 (two) times daily by mouth. ) 45 each 0   No current facility-administered medications for this visit.     REVIEW OF SYSTEMS:     Constitutional: Denies fevers, chills or abnormal night sweats Eyes: Denies blurriness of vision, double vision or watery eyes Ears, nose, mouth, throat, and face: Denies mucositis or sore throat Respiratory: Denies cough, dyspnea or wheezes.  Cardiovascular: negative  Gastrointestinal:  Denies nausea, heartburn.   Skin: Denies abnormal skin rashes  Lymphatics: Denies new lymphadenopathy or easy bruising Neurological:Denies numbness, tingling or new weaknesses Behavioral/Psych: Mood is stable, no new changes  All other systems were reviewed with the patient and are negative.  PHYSICAL EXAMINATION:   Vitals:   06/08/17 0844  BP: (!) 158/90  Pulse: 89  Resp: 20  Temp: 97.9 F (36.6 C)  SpO2: 100%   Filed Weights   06/08/17 0844  Weight: 214 lb 8 oz (97.3 kg)    GENERAL:alert, no distress and comfortable SKIN: skin color, texture, turgor are normal, no rashes or significant lesions EYES: normal, conjunctiva are  pink and non-injected, sclera clear OROPHARYNX:no exudate, no erythema and lips, buccal mucosa, and tongue normal  NECK: supple, thyroid normal size, non-tender, without nodularity LYMPH:  no palpable lymphadenopathy in the cervical, axillary or inguinal LUNGS: clear to auscultation and percussion with normal breathing effort HEART: regular rate & rhythm and no murmurs and no lower extremity edema ABDOMEN:abdomen soft, non-tender and normal bowel sounds Musculoskeletal:no cyanosis of digits and no clubbing  PSYCH: alert & oriented x 3 with fluent speech NEURO: no focal motor/sensory deficits  LABORATORY DATA:  I have reviewed the data as listed CBC CBC CBC Latest Ref Rng & Units 06/08/2017 05/08/2017 07/29/2014  WBC 3.9 - 10.3 K/uL 7.2 5.3 5.9  Hemoglobin 12.0 - 15.0 g/dL - - 10.5(L)  Hematocrit 34.8 - 46.6 % 41.3 41.5 33.5(L)  Platelets 145 - 400 K/uL 227 205 205     CMP Latest Ref Rng & Units 07/28/2014 07/27/2014 01/29/2007  Glucose 70 - 99 mg/dL 89 84 161(W128(H)   BUN 6 - 23 mg/dL 10 11 9   Creatinine 0.50 - 1.10 mg/dL 9.600.69 4.540.74 0.980.69  Sodium 135 - 145 mmol/L 137 137 137  Potassium 3.5 - 5.1 mmol/L 4.0 4.0 3.7  Chloride 96 - 112 mmol/L 106 105 104  CO2 19 - 32 mmol/L 25 25 24   Calcium 8.4 - 10.5 mg/dL 8.1(L) 9.0 8.9  Total Protein 6.0 - 8.3 g/dL - 7.5 7.1  Total Bilirubin 0.3 - 1.2 mg/dL - 0.3 0.5  Alkaline Phos 39 - 117 U/L - 98 70  AST 0 - 37 U/L - 23 25  ALT 0 - 35 U/L - 20 17     RADIOGRAPHIC STUDIES: I have personally reviewed the radiological images as listed and agreed with the findings in the report. No results found.  ASSESSMENT & PLAN:  No problem-specific Assessment & Plan notes found for this encounter.  42 y.o. woman with Iron deficiency anemia  1. Iron deficient Anemia  -Patient has long standing history of anemia, previously with pregnancy and menorrhagia. Ferritin was low, low MCV, this is consistent with iron deficient anemia.  -Patient with prior hx of menorrhagia, resolved with OCP (light menstrual cycle once every 35 days lasting 3-4 days), however her anemia does not improve much since then.  -Also her stool OB was negative, I previously recommended endoscopy to ensure that there is no GI source of bleeding.  -Patient has been taking oral iron for more than 6 months, iron deficiency and anemia has not resolved, possible poor absorption. I recommended IV iron. -IV feraheme discussed and recommended with patient due to patient low response to PO Niferex. Benefit and the potential side effects, especially allergy reaction including anaphylactic reactions, were discussed with patient, she agrees to proceed. -Given her <7 Hg, she received weekly IV Feraheme on 03/22/17 and 03/29/17. She responded well to IV iron and anemia resolved in 04/2017.  -Her comeplete GI workup with [facility] was negative  -She will continue to see her GYN for any menorrhagia management.  -Labs still pending for today -Will continue 150mg  Niferex BID with  vitamin C or orange juice. -She will be monitored every 2 months for labs and I will see her in 6 months.    Plan:  continue 150mg  Niferex BID Lab every 2 months X3 Lab in 6 months     All questions were answered. The patient knows to call the clinic with any problems, questions or concerns. I spent 10 minutes counseling the patient face to face.  The total time spent in the appointment was 15 minutes and more than I have reviewed the above documentation for accuracy and completeness, and I agree with the above.50% was on counseling.     Malachy Mood, MD 06/08/17   This document serves as a record of services personally performed by Malachy Mood, MD. It was created on her behalf by Delphina Cahill, a trained medical scribe. The creation of this record is based on the scribe's personal observations and the provider's statements to them.    I have reviewed the above documentation for accuracy and completeness, and I agree with the above.

## 2017-06-08 ENCOUNTER — Encounter: Payer: Self-pay | Admitting: Hematology

## 2017-06-08 ENCOUNTER — Inpatient Hospital Stay: Payer: Commercial Managed Care - PPO | Attending: Hematology | Admitting: Hematology

## 2017-06-08 ENCOUNTER — Inpatient Hospital Stay: Payer: Commercial Managed Care - PPO

## 2017-06-08 ENCOUNTER — Telehealth: Payer: Self-pay

## 2017-06-08 VITALS — BP 158/90 | HR 89 | Temp 97.9°F | Resp 20 | Ht 64.0 in | Wt 214.5 lb

## 2017-06-08 DIAGNOSIS — K219 Gastro-esophageal reflux disease without esophagitis: Secondary | ICD-10-CM

## 2017-06-08 DIAGNOSIS — R0602 Shortness of breath: Secondary | ICD-10-CM | POA: Insufficient documentation

## 2017-06-08 DIAGNOSIS — R002 Palpitations: Secondary | ICD-10-CM | POA: Diagnosis not present

## 2017-06-08 DIAGNOSIS — Z79899 Other long term (current) drug therapy: Secondary | ICD-10-CM | POA: Diagnosis not present

## 2017-06-08 DIAGNOSIS — Z888 Allergy status to other drugs, medicaments and biological substances status: Secondary | ICD-10-CM | POA: Diagnosis not present

## 2017-06-08 DIAGNOSIS — N920 Excessive and frequent menstruation with regular cycle: Secondary | ICD-10-CM | POA: Diagnosis not present

## 2017-06-08 DIAGNOSIS — Z9049 Acquired absence of other specified parts of digestive tract: Secondary | ICD-10-CM | POA: Diagnosis not present

## 2017-06-08 DIAGNOSIS — R197 Diarrhea, unspecified: Secondary | ICD-10-CM

## 2017-06-08 DIAGNOSIS — D500 Iron deficiency anemia secondary to blood loss (chronic): Secondary | ICD-10-CM

## 2017-06-08 LAB — CBC WITH DIFFERENTIAL (CANCER CENTER ONLY)
Basophils Absolute: 0 10*3/uL (ref 0.0–0.1)
Basophils Relative: 0 %
Eosinophils Absolute: 0.1 10*3/uL (ref 0.0–0.5)
Eosinophils Relative: 2 %
HEMATOCRIT: 41.3 % (ref 34.8–46.6)
HEMOGLOBIN: 13.4 g/dL (ref 11.6–15.9)
LYMPHS ABS: 2.2 10*3/uL (ref 0.9–3.3)
LYMPHS PCT: 30 %
MCH: 28.9 pg (ref 25.1–34.0)
MCHC: 32.4 g/dL (ref 31.5–36.0)
MCV: 89.2 fL (ref 79.5–101.0)
Monocytes Absolute: 0.5 10*3/uL (ref 0.1–0.9)
Monocytes Relative: 7 %
NEUTROS PCT: 61 %
NRBC: 0 /100{WBCs}
Neutro Abs: 4.4 10*3/uL (ref 1.5–6.5)
PLATELETS: 227 10*3/uL (ref 145–400)
RBC: 4.63 MIL/uL (ref 3.70–5.45)
RDW: 18.1 % — ABNORMAL HIGH (ref 11.2–14.5)
WBC: 7.2 10*3/uL (ref 3.9–10.3)

## 2017-06-08 LAB — RETICULOCYTES
RBC.: 4.63 MIL/uL (ref 3.70–5.45)
RETIC COUNT ABSOLUTE: 46.3 10*3/uL (ref 33.7–90.7)
Retic Ct Pct: 1 % (ref 0.7–2.1)

## 2017-06-08 LAB — FERRITIN: Ferritin: 38 ng/mL (ref 9–269)

## 2017-06-08 NOTE — Telephone Encounter (Signed)
  Printed avs and calender of upcoming appointment. Per 2/21 los 

## 2017-08-03 ENCOUNTER — Other Ambulatory Visit: Payer: Commercial Managed Care - PPO

## 2017-08-14 ENCOUNTER — Inpatient Hospital Stay: Payer: Commercial Managed Care - PPO

## 2017-09-22 DIAGNOSIS — I10 Essential (primary) hypertension: Secondary | ICD-10-CM | POA: Diagnosis not present

## 2017-10-03 DIAGNOSIS — Z1231 Encounter for screening mammogram for malignant neoplasm of breast: Secondary | ICD-10-CM | POA: Diagnosis not present

## 2017-10-03 DIAGNOSIS — Z01419 Encounter for gynecological examination (general) (routine) without abnormal findings: Secondary | ICD-10-CM | POA: Diagnosis not present

## 2017-10-03 DIAGNOSIS — R300 Dysuria: Secondary | ICD-10-CM | POA: Diagnosis not present

## 2017-10-05 ENCOUNTER — Inpatient Hospital Stay: Payer: Commercial Managed Care - PPO | Attending: Hematology

## 2017-10-05 DIAGNOSIS — D500 Iron deficiency anemia secondary to blood loss (chronic): Secondary | ICD-10-CM

## 2017-10-05 DIAGNOSIS — N920 Excessive and frequent menstruation with regular cycle: Secondary | ICD-10-CM | POA: Insufficient documentation

## 2017-10-05 LAB — CBC WITH DIFFERENTIAL (CANCER CENTER ONLY)
Basophils Absolute: 0 10*3/uL (ref 0.0–0.1)
Basophils Relative: 0 %
Eosinophils Absolute: 0.2 10*3/uL (ref 0.0–0.5)
Eosinophils Relative: 4 %
HCT: 39 % (ref 34.8–46.6)
Hemoglobin: 12.6 g/dL (ref 11.6–15.9)
Lymphocytes Relative: 27 %
Lymphs Abs: 1.7 10*3/uL (ref 0.9–3.3)
MCH: 28.4 pg (ref 25.1–34.0)
MCHC: 32.3 g/dL (ref 31.5–36.0)
MCV: 88 fL (ref 79.5–101.0)
Monocytes Absolute: 0.4 10*3/uL (ref 0.1–0.9)
Monocytes Relative: 6 %
Neutro Abs: 4 10*3/uL (ref 1.5–6.5)
Neutrophils Relative %: 63 %
Platelet Count: 248 10*3/uL (ref 145–400)
RBC: 4.43 MIL/uL (ref 3.70–5.45)
RDW: 12.7 % (ref 11.2–14.5)
WBC Count: 6.4 10*3/uL (ref 3.9–10.3)

## 2017-10-05 LAB — RETICULOCYTES
RBC.: 4.43 MIL/uL (ref 3.70–5.45)
Retic Count, Absolute: 53.2 10*3/uL (ref 33.7–90.7)
Retic Ct Pct: 1.2 % (ref 0.7–2.1)

## 2017-10-05 LAB — IRON AND TIBC
IRON: 39 ug/dL — AB (ref 41–142)
SATURATION RATIOS: 8 % — AB (ref 21–57)
TIBC: 466 ug/dL — AB (ref 236–444)
UIBC: 427 ug/dL

## 2017-10-05 LAB — FERRITIN: Ferritin: 17 ng/mL (ref 9–269)

## 2017-10-09 ENCOUNTER — Telehealth: Payer: Self-pay

## 2017-10-09 NOTE — Telephone Encounter (Signed)
-----   Message from Malachy MoodYan Feng, MD sent at 10/09/2017  8:10 AM EDT ----- Please let pt know that her iron level is low and I will set up her iv feraheme twice in the next 2-3 weeks, thanks  Malachy MoodYan Feng  10/09/2017

## 2017-10-09 NOTE — Telephone Encounter (Signed)
Left voice message for patient per Dr. Mosetta PuttFeng her iron level is low and needs to have IV feraheme twice within the next 2 to 3 weeks.  Informed scheduling will be calling her with appointments.  Encouraged patient to call back if she has questions.

## 2017-10-10 ENCOUNTER — Telehealth: Payer: Self-pay | Admitting: Hematology

## 2017-10-10 NOTE — Telephone Encounter (Signed)
Called pt re appts that were added per 6/21 sch msg - left vm for pt re appts.  °

## 2017-10-20 ENCOUNTER — Telehealth: Payer: Self-pay | Admitting: Hematology

## 2017-10-20 NOTE — Telephone Encounter (Signed)
Left patient a message regarding 7/17 and 7/25

## 2017-10-26 ENCOUNTER — Ambulatory Visit: Payer: Commercial Managed Care - PPO

## 2017-11-01 ENCOUNTER — Telehealth: Payer: Self-pay | Admitting: Hematology

## 2017-11-01 ENCOUNTER — Inpatient Hospital Stay: Payer: Commercial Managed Care - PPO

## 2017-11-01 NOTE — Telephone Encounter (Signed)
Patient called in and left vmail to cancel todays appt. Unable to come in - left message for patient to call back and reschedule.

## 2017-11-02 ENCOUNTER — Ambulatory Visit: Payer: Commercial Managed Care - PPO

## 2017-11-07 DIAGNOSIS — I10 Essential (primary) hypertension: Secondary | ICD-10-CM | POA: Diagnosis not present

## 2017-11-08 ENCOUNTER — Telehealth: Payer: Self-pay | Admitting: Hematology

## 2017-11-08 NOTE — Telephone Encounter (Signed)
Called pt re appt that was changed per 7/24 - spoke w/ pt re appts

## 2017-11-09 ENCOUNTER — Inpatient Hospital Stay: Payer: Commercial Managed Care - PPO | Attending: Hematology

## 2017-11-09 ENCOUNTER — Inpatient Hospital Stay: Payer: Commercial Managed Care - PPO

## 2017-11-09 VITALS — BP 127/84 | HR 85 | Temp 98.1°F | Resp 16

## 2017-11-09 DIAGNOSIS — D500 Iron deficiency anemia secondary to blood loss (chronic): Secondary | ICD-10-CM | POA: Diagnosis present

## 2017-11-09 DIAGNOSIS — N920 Excessive and frequent menstruation with regular cycle: Secondary | ICD-10-CM | POA: Insufficient documentation

## 2017-11-09 MED ORDER — SODIUM CHLORIDE 0.9 % IV SOLN
510.0000 mg | Freq: Once | INTRAVENOUS | Status: AC
  Administered 2017-11-09: 510 mg via INTRAVENOUS
  Filled 2017-11-09: qty 17

## 2017-11-09 NOTE — Patient Instructions (Signed)

## 2017-11-15 ENCOUNTER — Inpatient Hospital Stay: Payer: Commercial Managed Care - PPO

## 2017-11-15 VITALS — BP 120/77 | HR 79 | Temp 98.6°F | Resp 17

## 2017-11-15 DIAGNOSIS — N920 Excessive and frequent menstruation with regular cycle: Secondary | ICD-10-CM | POA: Diagnosis not present

## 2017-11-15 DIAGNOSIS — D500 Iron deficiency anemia secondary to blood loss (chronic): Secondary | ICD-10-CM

## 2017-11-15 MED ORDER — SODIUM CHLORIDE 0.9 % IV SOLN
Freq: Once | INTRAVENOUS | Status: AC
  Administered 2017-11-15: 09:00:00 via INTRAVENOUS
  Filled 2017-11-15: qty 250

## 2017-11-15 MED ORDER — FERUMOXYTOL INJECTION 510 MG/17 ML
510.0000 mg | Freq: Once | INTRAVENOUS | Status: AC
  Administered 2017-11-15: 510 mg via INTRAVENOUS
  Filled 2017-11-15: qty 17

## 2017-11-15 NOTE — Patient Instructions (Signed)

## 2018-02-07 ENCOUNTER — Telehealth: Payer: Self-pay

## 2018-02-07 ENCOUNTER — Telehealth: Payer: Self-pay | Admitting: Hematology

## 2018-02-07 NOTE — Telephone Encounter (Signed)
Patient called requesting she get labs drawn where she works (at [facility]) and then have f/u with Dr. Mosetta Putt in November.  Faxed needed labs to her office.

## 2018-02-07 NOTE — Telephone Encounter (Signed)
Patient called to cancel °

## 2018-02-08 ENCOUNTER — Inpatient Hospital Stay: Payer: Commercial Managed Care - PPO

## 2018-02-08 ENCOUNTER — Telehealth: Payer: Self-pay | Admitting: Hematology

## 2018-02-08 ENCOUNTER — Inpatient Hospital Stay: Payer: Commercial Managed Care - PPO | Admitting: Hematology

## 2018-02-08 NOTE — Telephone Encounter (Signed)
LVm for pt regarding upcoming appts per 10/23 sch message

## 2018-02-26 DIAGNOSIS — D649 Anemia, unspecified: Secondary | ICD-10-CM | POA: Diagnosis not present

## 2018-03-01 ENCOUNTER — Inpatient Hospital Stay: Payer: Commercial Managed Care - PPO | Admitting: Hematology

## 2018-03-10 NOTE — Progress Notes (Signed)
[facility]  Telephone:([phone]) [phone] Fax:([phone]) [phone]  Clinic Follow up Note   Patient Care Team: Maurice Small, MD as PCP - General (Family Medicine) 03/13/2018  Chief Complaint: F/u on IDA  CURRENT THERAPY  IV Feraheme/150mg  Niferex BID  INTERVAL HISTORY: Rebecca Rivers is a 42 y.o. female who is here for follow-up. She is here alone. 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 Her menses are heavy for 2 days. Her OB/GYN recommended ablation and she plans to do it next year. She denies shortness of breath.  Pertinent positives and negatives of review of systems are listed and detailed within the above HPI.   REVIEW OF SYSTEMS:   Constitutional: Denies fevers, chills or abnormal weight loss Eyes: Denies blurriness of vision Ears, nose, mouth, throat, and face: Denies mucositis or sore throat Respiratory: Denies cough, dyspnea or wheezes Cardiovascular: Denies palpitation, chest discomfort or lower extremity swelling Gastrointestinal:  Denies nausea, heartburn or change in bowel habits GU: (+) heavy menses Skin: Denies abnormal skin rashes Lymphatics: Denies new lymphadenopathy or easy bruising Neurological:Denies numbness, tingling or new weaknesses Behavioral/Psych: Mood is stable, no new changes  All other systems were reviewed with the patient and are negative.  MEDICAL HISTORY:  Past Medical History:  Diagnosis Date  . GERD (gastroesophageal reflux disease)   . Preterm labor     SURGICAL HISTORY: Past Surgical History:  Procedure Laterality Date  . ADENOIDECTOMY  1983  . ARTHROSCOPIC REPAIR ACL  2003  . CESAREAN SECTION    . CESAREAN SECTION  11/06/2010   Procedure: CESAREAN SECTION;  Surgeon: Serita Kyle, MD;  Location: [facility];  Service: Gynecology;  Laterality: N/A;  Repeat cesarean section with delivery of baby girl at 5. Apgars 7/9.  Marland Kitchen LAPAROSCOPIC CHOLECYSTECTOMY  2011    I have reviewed the social  history and family history with the patient and they are unchanged from previous note.  ALLERGIES:  is allergic to other; neomycin; prednisone; and thimerosal.  MEDICATIONS:  Current Outpatient Medications  Medication Sig Dispense Refill  . fexofenadine (ALLEGRA) 180 MG tablet Take 180 mg by mouth daily.    Marland Kitchen Amiaya 0.35 MG tablet BCP    . ibuprofen (ADVIL,MOTRIN) 600 MG tablet Take 1 tablet (600 mg total) by mouth every 6 (six) hours as needed for headache. 30 tablet 0  . lisinopril (PRINIVIL,ZESTRIL) 10 MG tablet Take 10 mg by mouth daily.    Marland Kitchen omeprazole (PRILOSEC) 20 MG capsule Take 20 mg daily by mouth.    . polysaccharide iron (NIFEREX) 150 MG CAPS capsule Take 1 capsule (150 mg total) by mouth daily. (Patient taking differently: Take 150 mg 2 (two) times daily by mouth. ) 45 each 0   No current facility-administered medications for this visit.     PHYSICAL EXAMINATION: ECOG PERFORMANCE STATUS: 0 - Asymptomatic  Vitals:   03/13/18 0820  BP: (!) 142/82  Pulse: 88  Resp: 20  Temp: 98.4 F (36.9 C)  SpO2: 100%   Filed Weights   03/13/18 0820  Weight: 220 lb 11.2 oz (100.1 kg)    GENERAL:alert, no distress and comfortable SKIN: skin color, texture, turgor are normal, no rashes or significant lesions EYES: normal, Conjunctiva are pink and non-injected, sclera clear OROPHARYNX:no exudate, no erythema and lips, buccal mucosa, and tongue normal  NECK: supple, thyroid normal size, non-tender, without nodularity LYMPH:  no palpable lymphadenopathy in the cervical, axillary or inguinal LUNGS: clear to auscultation and percussion with normal  breathing effort HEART: regular rate & rhythm and no murmurs and no lower extremity edema ABDOMEN:abdomen soft, non-tender and normal bowel sounds Musculoskeletal:no cyanosis of digits and no clubbing  NEURO: alert & oriented x 3 with fluent speech, no focal motor/sensory deficits  LABORATORY DATA:  I have reviewed the data as  listed CBC Latest Ref Rng & Units 10/05/2017 06/08/2017 05/08/2017  WBC 3.9 - 10.3 K/uL 6.4 7.2 5.3  Hemoglobin 11.6 - 15.9 g/dL 16.112.6 09.613.4 04.513.3  Hematocrit 34.8 - 46.6 % 39.0 41.3 41.5  Platelets 145 - 400 K/uL 248 227 205     CMP Latest Ref Rng & Units 07/28/2014 07/27/2014 01/29/2007  Glucose 70 - 99 mg/dL 89 84 409(W128(H)  BUN 6 - 23 mg/dL 10 11 9   Creatinine 0.50 - 1.10 mg/dL 1.190.69 1.470.74 8.290.69  Sodium 135 - 145 mmol/L 137 137 137  Potassium 3.5 - 5.1 mmol/L 4.0 4.0 3.7  Chloride 96 - 112 mmol/L 106 105 104  CO2 19 - 32 mmol/L 25 25 24   Calcium 8.4 - 10.5 mg/dL 8.1(L) 9.0 8.9  Total Protein 6.0 - 8.3 g/dL - 7.5 7.1  Total Bilirubin 0.3 - 1.2 mg/dL - 0.3 0.5  Alkaline Phos 39 - 117 U/L - 98 70  AST 0 - 37 U/L - 23 25  ALT 0 - 35 U/L - 20 17   Outside lab  February 26, 2018 WBC 5.7, hemoglobin 13.8, hematocrit 40.4%, MCV 93.7, platelets 237K Ferritin 249ng/ml Serum iron 95, iron saturation 33%, TIBC 286   RADIOGRAPHIC STUDIES: I have personally reviewed the radiological images as listed and agreed with the findings in the report. No results found.   ASSESSMENT & PLAN:  Rebecca Rivers is a 42 y.o. female with history of  1.  Iron deficient anemia, secondary to menorrhagia -Her lab work was consistent with iron deficiency and anemia, likely secondary to her menorrhagia. -Anemia has resolved after IV iron. -She has poor tolerance to oral iron, not taking oral iron most the time. -[facility], CBC showed Hg 13.8 Hct 40.4. Reticulocytes 1.6%. Iron studies WNLs. Ferritin 249. -[facility]. F/u in 6 months with lab 1 week before at her GYN office.  2. HTN and GERD -Continue medication and follow-up with PCP.  Plan  -f/u in 6 months -[facility]    No problem-specific Assessment & Plan notes found for this encounter.   No orders of the defined types were placed in this encounter.  All  questions were answered. The patient knows to call the clinic with any problems, questions or concerns. No barriers to learning was detected.   Elenor LegatoI, Noor Dweik am acting as scribe for Dr. Malachy MoodYan Jahquez Steffler.  I have reviewed the above documentation for accuracy and completeness, and I agree with the above.      Malachy MoodYan Juliannah Ohmann, MD 03/13/2018

## 2018-03-13 ENCOUNTER — Encounter: Payer: Self-pay | Admitting: Hematology

## 2018-03-13 ENCOUNTER — Inpatient Hospital Stay: Payer: Commercial Managed Care - PPO | Attending: Hematology | Admitting: Hematology

## 2018-03-13 VITALS — BP 142/82 | HR 88 | Temp 98.4°F | Resp 20 | Ht 64.0 in | Wt 220.7 lb

## 2018-03-13 DIAGNOSIS — K219 Gastro-esophageal reflux disease without esophagitis: Secondary | ICD-10-CM | POA: Diagnosis not present

## 2018-03-13 DIAGNOSIS — Z79899 Other long term (current) drug therapy: Secondary | ICD-10-CM | POA: Diagnosis not present

## 2018-03-13 DIAGNOSIS — D500 Iron deficiency anemia secondary to blood loss (chronic): Secondary | ICD-10-CM

## 2018-03-13 DIAGNOSIS — N920 Excessive and frequent menstruation with regular cycle: Secondary | ICD-10-CM | POA: Diagnosis not present

## 2018-03-13 DIAGNOSIS — I10 Essential (primary) hypertension: Secondary | ICD-10-CM

## 2018-03-13 DIAGNOSIS — Z791 Long term (current) use of non-steroidal anti-inflammatories (NSAID): Secondary | ICD-10-CM | POA: Diagnosis not present

## 2018-03-16 ENCOUNTER — Telehealth: Payer: Self-pay | Admitting: Hematology

## 2018-03-16 NOTE — Telephone Encounter (Signed)
Called patient and verified appointments for May 2020.

## 2018-06-18 DIAGNOSIS — M205X1 Other deformities of toe(s) (acquired), right foot: Secondary | ICD-10-CM | POA: Diagnosis not present

## 2018-06-18 DIAGNOSIS — L03031 Cellulitis of right toe: Secondary | ICD-10-CM | POA: Diagnosis not present

## 2018-09-12 NOTE — Progress Notes (Signed)
[facility]   Telephone:(336) [phone] Fax:(336) [phone]   Clinic Follow up Note   Patient Care Team: Maurice Small, MD as PCP - General (Family Medicine)   I connected with Rebecca Rivers on 09/14/2018 at  8:30 AM EDT by telephone visit and verified that I am speaking with the correct person using two identifiers.  I discussed the limitations, risks, security and privacy concerns of performing an evaluation and management service by telephone and the availability of in person appointments. I also discussed with the patient that there may be a patient responsible charge related to this service. The patient expressed understanding and agreed to proceed.   Patient's location:  Her car Provider's location:  My Office  CHIEF COMPLAINT: F/u on IDA  CURRENT THERAPY:  IV Feraheme as need and prenatal vitamin daily.   INTERVAL HISTORY:  Rebecca Rivers is here for a follow up of IDA. She was able to identify herself by birth date. 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 She work in [place] and [employer]. He notes she is doing well. He notes her ferritin were low need of normal range and Iron sat were lower. She notes her period is 3 days of heavy and the rest of period is moderate and normal.     REVIEW OF SYSTEMS:   Constitutional: Denies fevers, chills or abnormal weight loss Eyes: Denies blurriness of vision Ears, nose, mouth, throat, and face: Denies mucositis or sore throat Respiratory: Denies cough, dyspnea or wheezes Cardiovascular: Denies palpitation, chest discomfort or lower extremity swelling Gastrointestinal:  Denies nausea, heartburn or change in bowel habits Skin: Denies abnormal skin rashes Lymphatics: Denies new lymphadenopathy or easy bruising Neurological:Denies numbness, tingling or new weaknesses Behavioral/Psych: Mood is stable, no new changes  All other systems were reviewed with the patient and are negative.   MEDICAL HISTORY:  Past Medical History:  Diagnosis Date  . GERD (gastroesophageal reflux disease)   . Preterm labor     SURGICAL HISTORY: Past Surgical History:  Procedure Laterality Date  . ADENOIDECTOMY  1983  . ARTHROSCOPIC REPAIR ACL  2003  . CESAREAN SECTION    . CESAREAN SECTION  11/06/2010   Procedure: CESAREAN SECTION;  Surgeon: Serita Kyle, MD;  Location: [facility];  Service: Gynecology;  Laterality: N/A;  Repeat cesarean section with delivery of baby girl at 60. Apgars 7/9.  Marland Kitchen LAPAROSCOPIC CHOLECYSTECTOMY  2011    I have reviewed the social history and family history with the patient and they are unchanged from previous note.  ALLERGIES:  is allergic to other; neomycin; prednisone; and thimerosal.  MEDICATIONS:  Current Outpatient Medications  Medication Sig Dispense Refill  . fexofenadine (ALLEGRA) 180 MG tablet Take 180 mg by mouth daily.    Marland Kitchen Darlena 0.35 MG tablet BCP    . ibuprofen (ADVIL,MOTRIN) 600 MG tablet Take 1 tablet (600 mg total) by mouth every 6 (six) hours as needed for headache. 30 tablet 0  . lisinopril (PRINIVIL,ZESTRIL) 10 MG tablet Take 10 mg by mouth daily.    Marland Kitchen omeprazole (PRILOSEC) 20 MG capsule Take 20 mg daily by mouth.    . polysaccharide iron (NIFEREX) 150 MG CAPS capsule Take 1 capsule (150 mg total) by mouth daily. (Patient taking differently: Take 150 mg 2 (two) times daily by mouth. ) 45 each 0   No current facility-administered medications for this visit.     PHYSICAL EXAMINATION: ECOG PERFORMANCE STATUS: 0 - Asymptomatic  No vitals taken today, Exam not performed today  LABORATORY DATA:  I have reviewed the data as listed CBC Latest Ref Rng & Units 10/05/2017 06/08/2017 05/08/2017  WBC 3.9 - 10.3 K/uL 6.4 7.2 5.3  Hemoglobin 11.6 - 15.9 g/dL 16.112.6 09.613.4 04.513.3  Hematocrit 34.8 - 46.6 % 39.0 41.3 41.5  Platelets 145 - 400 K/uL 248 227 205     CMP Latest Ref Rng & Units 07/28/2014 07/27/2014 01/29/2007  Glucose 70 - 99 mg/dL  89 84 409(W128(H)  BUN 6 - 23 mg/dL 10 11 9   Creatinine 0.50 - 1.10 mg/dL 1.190.69 1.470.74 8.290.69  Sodium 135 - 145 mmol/L 137 137 137  Potassium 3.5 - 5.1 mmol/L 4.0 4.0 3.7  Chloride 96 - 112 mmol/L 106 105 104  CO2 19 - 32 mmol/L 25 25 24   Calcium 8.4 - 10.5 mg/dL 8.1(L) 9.0 8.9  Total Protein 6.0 - 8.3 g/dL - 7.5 7.1  Total Bilirubin 0.3 - 1.2 mg/dL - 0.3 0.5  Alkaline Phos 39 - 117 U/L - 98 70  AST 0 - 37 U/L - 23 25  ALT 0 - 35 U/L - 20 17      RADIOGRAPHIC STUDIES: I have personally reviewed the radiological images as listed and agreed with the findings in the report. No results found.   ASSESSMENT & PLAN:  Rebecca Rivers is a 43 y.o. female with   1.  Iron deficient anemia, secondary to menorrhagia -Her lab work was consistent with iron deficiency and anemia, likely secondary to her menorrhagia. -Anemia has resolved after IV iron. Last IV Feraheme in 10/2017.  -She has poor tolerance to oral iron, not taking oral iron most the time. She is currently taking Prenatal vitamins.  -Period remains heavy first few days.  -She is clinically doing very well. 09/11/18 CBC from [facility] reviewed, WBC 7.2, RBC 4.29, Hg 12.6, HCT 37.8, MCV 88.2, MCH 29.5, MCHC 33.4, RDW 12.9, PLT 273K. Her Ferritin 32 serum iron 41, saturation 11%. overall good, no need iv iron for now -If Ferritin <30 or low serum iron I would give IV iron.  -Will continue observation. She will continue prenatal vitamin.  -[facility]. F/u in 6 months.   2. HTN and GERD -Continue medication and follow-up with PCP.  Plan  -Lab and f/u in 6 months -[facility]abs CBC and [facility]   No problem-specific Assessment & Plan notes found for this encounter.   No orders of the defined types were placed in this encounter.  I discussed the assessment and treatment plan with the patient. The patient was provided an opportunity to ask questions and all were answered. The patient  agreed with the plan and demonstrated an understanding of the instructions.  The patient was advised to call back or seek an in-person evaluation if the symptoms worsen or if the condition fails to improve as anticipated.  I provided 10 minutes of non face-to-face telephone visit time during this encounter, and > 50% was spent counseling as documented under my assessment & plan.    Malachy MoodYan Ryliegh Mcduffey, MD 09/14/2018   I, Delphina CahillAmoya Bennett, am acting as scribe for Malachy MoodYan Barclay Lennox, MD.   I have reviewed the above documentation for accuracy and completeness, and I agree with the above.

## 2018-09-13 ENCOUNTER — Telehealth: Payer: Self-pay | Admitting: Hematology

## 2018-09-14 ENCOUNTER — Inpatient Hospital Stay: Payer: Commercial Managed Care - PPO | Attending: Hematology | Admitting: Hematology

## 2018-09-14 ENCOUNTER — Encounter: Payer: Self-pay | Admitting: Hematology

## 2018-09-14 ENCOUNTER — Telehealth: Payer: Self-pay | Admitting: Hematology

## 2018-09-14 DIAGNOSIS — D500 Iron deficiency anemia secondary to blood loss (chronic): Secondary | ICD-10-CM | POA: Diagnosis not present

## 2018-09-14 DIAGNOSIS — K219 Gastro-esophageal reflux disease without esophagitis: Secondary | ICD-10-CM

## 2018-09-14 DIAGNOSIS — N920 Excessive and frequent menstruation with regular cycle: Secondary | ICD-10-CM

## 2018-09-14 DIAGNOSIS — I10 Essential (primary) hypertension: Secondary | ICD-10-CM

## 2018-09-14 NOTE — Telephone Encounter (Signed)
Scheduled appt per 5/29 los. °

## 2019-03-13 ENCOUNTER — Telehealth: Payer: Self-pay | Admitting: Hematology

## 2019-03-13 NOTE — Telephone Encounter (Signed)
Returned patient's phone call regarding rescheduling 11/30 appointment, voicemail not set up.

## 2019-03-18 ENCOUNTER — Inpatient Hospital Stay: Payer: Commercial Managed Care - PPO | Attending: Hematology

## 2019-03-18 ENCOUNTER — Inpatient Hospital Stay: Payer: Commercial Managed Care - PPO | Admitting: Hematology
